# Patient Record
Sex: Male | Born: 1995 | Race: White | Hispanic: No | Marital: Single | Smoking: Current every day smoker
Health system: Southern US, Community
[De-identification: ages and names within clinical notes are randomized; demographics above are authoritative.]

## PROBLEM LIST (undated history)

## (undated) DIAGNOSIS — E669 Obesity, unspecified: Secondary | ICD-10-CM

## (undated) DIAGNOSIS — R51 Headache: Secondary | ICD-10-CM

## (undated) DIAGNOSIS — F4310 Post-traumatic stress disorder, unspecified: Secondary | ICD-10-CM

---
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
## 2019-01-09 ENCOUNTER — Emergency Department (HOSPITAL_COMMUNITY)
Admission: EM | Admit: 2019-01-09 | Discharge: 2019-01-10 | Disposition: A | Discharge status: Home / Self Care | Payer: Self-pay | Attending: Emergency Medicine | Admitting: Emergency Medicine

## 2019-01-09 ENCOUNTER — Encounter (HOSPITAL_COMMUNITY): Payer: Self-pay

## 2019-01-09 ENCOUNTER — Other Ambulatory Visit: Payer: Self-pay

## 2019-01-09 DIAGNOSIS — Z79899 Other long term (current) drug therapy: Secondary | ICD-10-CM | POA: Insufficient documentation

## 2019-01-09 DIAGNOSIS — F329 Major depressive disorder, single episode, unspecified: Secondary | ICD-10-CM | POA: Insufficient documentation

## 2019-01-09 DIAGNOSIS — R45851 Suicidal ideations: Secondary | ICD-10-CM | POA: Insufficient documentation

## 2019-01-09 DIAGNOSIS — R4587 Impulsiveness: Secondary | ICD-10-CM | POA: Insufficient documentation

## 2019-01-09 DIAGNOSIS — F1014 Alcohol abuse with alcohol-induced mood disorder: Secondary | ICD-10-CM

## 2019-01-09 DIAGNOSIS — F17290 Nicotine dependence, other tobacco product, uncomplicated: Secondary | ICD-10-CM | POA: Insufficient documentation

## 2019-01-09 DIAGNOSIS — Z008 Encounter for other general examination: Secondary | ICD-10-CM

## 2019-01-09 DIAGNOSIS — F1010 Alcohol abuse, uncomplicated: Secondary | ICD-10-CM | POA: Insufficient documentation

## 2019-01-09 DIAGNOSIS — R462 Strange and inexplicable behavior: Secondary | ICD-10-CM | POA: Insufficient documentation

## 2019-01-09 DIAGNOSIS — F419 Anxiety disorder, unspecified: Secondary | ICD-10-CM | POA: Insufficient documentation

## 2019-01-09 DIAGNOSIS — F17210 Nicotine dependence, cigarettes, uncomplicated: Secondary | ICD-10-CM | POA: Insufficient documentation

## 2019-01-09 HISTORY — DX: Post-traumatic stress disorder, unspecified: F43.10

## 2019-01-09 LAB — SALICYLATE LEVEL

## 2019-01-09 LAB — COMPREHENSIVE METABOLIC PANEL
ALK PHOS: 77 U/L (ref 38–126)
ALT: 40 U/L (ref 0–44)
AST: 28 U/L (ref 15–41)
Albumin: 4.6 g/dL (ref 3.5–5.0)
Anion gap: 10 (ref 5–15)
BUN: 9 mg/dL (ref 6–20)
CALCIUM: 8.9 mg/dL (ref 8.9–10.3)
CHLORIDE: 109 mmol/L (ref 98–111)
CO2: 23 mmol/L (ref 22–32)
Creatinine, Ser: 0.68 mg/dL (ref 0.61–1.24)
GFR calc Af Amer: >60 mL/min (ref >60)
GFR calc non Af Amer: >60 mL/min (ref >60)
GLUCOSE: 92 mg/dL (ref 70–99)
Potassium: 4 mmol/L (ref 3.5–5.1)
SODIUM: 142 mmol/L (ref 135–145)
Total Bilirubin: 0.4 mg/dL (ref 0.3–1.2)
Total Protein: 7.7 g/dL (ref 6.5–8.1)

## 2019-01-09 LAB — RAPID URINE DRUG SCREEN, HOSP PERFORMED
Amphetamines: NOT DETECTED
BARBITURATES: NOT DETECTED
BENZODIAZEPINES: NOT DETECTED
Cocaine: NOT DETECTED
OPIATES: NOT DETECTED
TETRAHYDROCANNABINOL: POSITIVE — AB

## 2019-01-09 LAB — CBC
HCT: 43.7 % (ref 39.0–52.0)
Hemoglobin: 14.5 g/dL (ref 13.0–17.0)
MCH: 31.5 pg (ref 26.0–34.0)
MCHC: 33.2 g/dL (ref 30.0–36.0)
MCV: 94.8 fL (ref 80.0–100.0)
NRBC: 0 % (ref 0.0–0.2)
Platelets: 440 10*3/uL — ABNORMAL HIGH (ref 150–400)
RBC: 4.61 MIL/uL (ref 4.22–5.81)
RDW: 12.7 % (ref 11.5–15.5)
WBC: 6.2 10*3/uL (ref 4.0–10.5)

## 2019-01-09 LAB — ACETAMINOPHEN LEVEL

## 2019-01-09 LAB — ETHANOL: Alcohol, Ethyl (B): 133 mg/dL — ABNORMAL HIGH (ref <10)

## 2019-01-09 MED ORDER — LORAZEPAM 2 MG/ML IJ SOLN: 0.0000 mg | Freq: Four times a day (QID) | INTRAMUSCULAR | Status: DC

## 2019-01-09 MED ORDER — THIAMINE HCL 100 MG/ML IJ SOLN: 100.0000 mg | Freq: Every day | INTRAMUSCULAR | Status: DC

## 2019-01-09 MED ORDER — VITAMIN B-1 100 MG PO TABS
100.0000 mg | ORAL_TABLET | Freq: Every day | ORAL | Status: DC
  Administered 2019-01-10: 100 mg via ORAL
  Filled 2019-01-09: qty 1

## 2019-01-09 MED ORDER — LORAZEPAM 2 MG/ML IJ SOLN: 0.0000 mg | Freq: Two times a day (BID) | INTRAMUSCULAR | Status: DC

## 2019-01-09 MED ORDER — LORAZEPAM 1 MG PO TABS: 0.0000 mg | ORAL_TABLET | Freq: Two times a day (BID) | ORAL | Status: DC

## 2019-01-09 MED ORDER — LORAZEPAM 1 MG PO TABS: 0.0000 mg | ORAL_TABLET | Freq: Four times a day (QID) | ORAL | Status: DC

## 2019-01-09 MED ORDER — LORAZEPAM 1 MG PO TABS
1.0000 mg | ORAL_TABLET | Freq: Once | ORAL | Status: AC
  Administered 2019-01-09: 1 mg via ORAL
  Filled 2019-01-09: qty 1

## 2019-01-09 NOTE — Progress Notes (Signed)
EKG hand delivered to Dr. Bebe Shaggy, MD.

## 2019-01-09 NOTE — ED Notes (Signed)
Bed: [facility]4 Expected date:  Expected time:  Means of arrival:  Comments: 

## 2019-01-09 NOTE — ED Triage Notes (Signed)
States came here at mothers request has had some alcohol earlier today but denies SI/HI today.
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
## 2019-01-09 NOTE — ED Notes (Signed)
Report on Situation, Background, Assessment, and Recommendations received from Triage, RN. Patient transferred to Acute Care Unit. Patient alert and in no visible distress, denies pain currently. Patient denies SI, HI, AVH and expresses he can remain safe on the unit. Patient made aware of Q15 minute rounds and security cameras for their safety. Patient instructed to come to me with needs or concerns. Pt. Given orientation of the unit and room. Pt. Given fluids and offered snack. Pt. Endorses his mood as a little anxious and depressed. Pt. States, "sometimes you just can't trust your family", referring to why he is here. Pt. Smells of alcohol and also reports he has been drinking just prior to admission to the unit.

## 2019-01-09 NOTE — Progress Notes (Signed)
Belongings placed in locker 43.

## 2019-01-09 NOTE — Progress Notes (Signed)
Patient arrived to the acute care unit at this time.

## 2019-01-10 DIAGNOSIS — F1014 Alcohol abuse with alcohol-induced mood disorder: Secondary | ICD-10-CM

## 2019-01-10 MED ORDER — GABAPENTIN 100 MG PO CAPS
200.0000 mg | ORAL_CAPSULE | Freq: Two times a day (BID) | ORAL | Status: DC
  Administered 2019-01-10: 200 mg via ORAL
  Filled 2019-01-10: qty 2

## 2019-01-10 NOTE — ED Notes (Signed)
Pt discharged home. Discharged instructions read to pt who verbalized understanding. All belongings returned to pt who signed for same. Denies SI/HI, is not delusional and not responding to internal stimuli. Escorted pt to the ED exit.    

## 2019-01-10 NOTE — Patient Outreach (Signed)
[facility]PSS met with the patient to provide substance use recovery support with lived experience from [place] and help with substance use treatment resources. Patient states that he does not want help with getting connected to substance use recovery resources and does not feel like his substance use is unmanageable at this time. CPSS still provided CPSS contact information. CPSS strongly encouraged the patient to follow up with [facility]PSS if his substance use becomes unmanageable and would like help getting connected to substance use recovery resources.

## 2019-01-10 NOTE — ED Notes (Addendum)
Nira Conn, NP, recommends overnight observation for safety and stabilization with psych re evaluation in the morning due to intoxication, patient was unable to answer needed questions. Patient gave permission for clinician to contact mother, but patient unable to remember mothers phone number information.
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
## 2019-01-10 NOTE — [facility] Suicide Risk Assessment (Cosign Needed)
Suicide Risk Assessment  Discharge Assessment   [facility] Discharge Suicide Risk Assessment   Principal Problem: Alcohol abuse with alcohol-induced mood disorder [facility]) Discharge Diagnoses: Principal Problem:   Alcohol abuse with alcohol-induced mood disorder (HCC)   Total Time spent with patient: 30 minutes  Musculoskeletal: Strength & Muscle Tone: within normal limits Gait & Station: normal Patient leans: N/A  Psychiatric Specialty Exam:   Blood pressure (!) 112/56, pulse 81, temperature 97.6 F (36.4 C), temperature source Oral, resp. rate 17, height 6\' 3"  (1.905 m), weight 81.6 kg, SpO2 99 %.Body mass index is 22.5 kg/m.  General Appearance: Casual  Eye Contact::  Good  Speech:  Clear and Coherent and Normal Rate409  Volume:  Normal  Mood:  Anxious  Affect:  Congruent  Thought Process:  Coherent, Goal Directed, Linear and Descriptions of Associations: Intact  Orientation:  Full (Time, Place, and Person)  Thought Content:  Logical  Suicidal Thoughts:  No  Homicidal Thoughts:  No  Memory:  Immediate;   Good Recent;   Good Remote;   Fair  Judgement:  Fair  Insight:  Fair  Psychomotor Activity:  Normal  Concentration:  Fair  Recall:  [employer] of Knowledge:Good  Language: Good  Akathisia:  No  Handed:  Right  AIMS (if indicated):     Assets:  Architect Housing Social Support  Sleep:     Cognition: WNL  ADL's:  Intact   Mental Status Per Nursing Assessment::   On Admission:   Pt was seen and chart reviewed by treatment team an Dr Jannifer Franklin. Pt is not sure why he is at the hospital. Pt's UDS positive for THC and BAL 133. Pt's mother wanted him to come to the hospital because he has been drinking. Pt was hospitalized in a psychiatric facility as a teen but currently does not take medications or see a psychiatrist. Pt will be referred to [facility] in [place] and also will be seen by peer support prior to discharge. Pt is  psychiatrically clear.   Demographic Factors:  Male, Adolescent or young adult, Caucasian, Low socioeconomic status and Unemployed  Loss Factors: Financial problems/change in socioeconomic status  Historical Factors: Family history of mental illness or substance abuse  Risk Reduction Factors:   Sense of responsibility to family  Continued Clinical Symptoms:  Alcohol/Substance Abuse/Dependencies  Cognitive Features That Contribute To Risk:  Closed-mindedness    Suicide Risk:  Minimal: No identifiable suicidal ideation.  Patients presenting with no risk factors but with morbid ruminations; may be classified as minimal risk based on the severity of the depressive symptoms  Follow-up Information    [facility] Follow up.   Contact information: [address] [place] [address] [phone]           Plan Of Care/Follow-up recommendations:  Activity:  as tolerated Diet:  Heart Healthy  Laveda Abbe, NP 01/10/2019, 12:42 PM

## 2019-01-26 ENCOUNTER — Encounter (HOSPITAL_COMMUNITY): Payer: Self-pay | Admitting: Emergency Medicine

## 2019-01-26 ENCOUNTER — Emergency Department (HOSPITAL_COMMUNITY)
Admission: EM | Admit: 2019-01-26 | Discharge: 2019-01-27 | Disposition: A | Discharge status: Other Acute Inpatient Hospital | Payer: Self-pay | Attending: Emergency Medicine | Admitting: Emergency Medicine

## 2019-01-26 ENCOUNTER — Emergency Department (HOSPITAL_COMMUNITY): Payer: Self-pay

## 2019-01-26 ENCOUNTER — Other Ambulatory Visit: Payer: Self-pay

## 2019-01-26 ENCOUNTER — Ambulatory Visit (HOSPITAL_COMMUNITY)
Admission: RE | Admit: 2019-01-26 | Discharge: 2019-01-26 | Disposition: A | Discharge status: Home / Self Care | Payer: Self-pay | Attending: Psychiatry | Admitting: Psychiatry

## 2019-01-26 DIAGNOSIS — T1491XA Suicide attempt, initial encounter: Secondary | ICD-10-CM | POA: Diagnosis present

## 2019-01-26 DIAGNOSIS — F333 Major depressive disorder, recurrent, severe with psychotic symptoms: Secondary | ICD-10-CM | POA: Insufficient documentation

## 2019-01-26 DIAGNOSIS — F17210 Nicotine dependence, cigarettes, uncomplicated: Secondary | ICD-10-CM | POA: Insufficient documentation

## 2019-01-26 DIAGNOSIS — R45851 Suicidal ideations: Secondary | ICD-10-CM | POA: Insufficient documentation

## 2019-01-26 LAB — COMPREHENSIVE METABOLIC PANEL
ALT: 25 U/L (ref 0–44)
AST: 24 U/L (ref 15–41)
Albumin: 4.8 g/dL (ref 3.5–5.0)
Alkaline Phosphatase: 80 U/L (ref 38–126)
Anion gap: 15 (ref 5–15)
BUN: 9 mg/dL (ref 6–20)
CO2: 19 mmol/L — AB (ref 22–32)
Calcium: 9.7 mg/dL (ref 8.9–10.3)
Chloride: 104 mmol/L (ref 98–111)
Creatinine, Ser: 0.86 mg/dL (ref 0.61–1.24)
GFR calc Af Amer: >60 mL/min (ref >60)
GFR calc non Af Amer: >60 mL/min (ref >60)
Glucose, Bld: 86 mg/dL (ref 70–99)
Potassium: 3.3 mmol/L — ABNORMAL LOW (ref 3.5–5.1)
Sodium: 138 mmol/L (ref 135–145)
Total Bilirubin: 1.5 mg/dL — ABNORMAL HIGH (ref 0.3–1.2)
Total Protein: 8.2 g/dL — ABNORMAL HIGH (ref 6.5–8.1)

## 2019-01-26 LAB — SALICYLATE LEVEL: Salicylate Lvl: <7 mg/dL (ref 2.8–30.0)

## 2019-01-26 LAB — CBC
HCT: 49.9 % (ref 39.0–52.0)
Hemoglobin: 17.1 g/dL — ABNORMAL HIGH (ref 13.0–17.0)
MCH: 31.6 pg (ref 26.0–34.0)
MCHC: 34.3 g/dL (ref 30.0–36.0)
MCV: 92.2 fL (ref 80.0–100.0)
NRBC: 0 % (ref 0.0–0.2)
Platelets: 481 10*3/uL — ABNORMAL HIGH (ref 150–400)
RBC: 5.41 MIL/uL (ref 4.22–5.81)
RDW: 12.9 % (ref 11.5–15.5)
WBC: 8.7 10*3/uL (ref 4.0–10.5)

## 2019-01-26 LAB — RAPID URINE DRUG SCREEN, HOSP PERFORMED
Amphetamines: POSITIVE — AB
Barbiturates: NOT DETECTED
Benzodiazepines: POSITIVE — AB
COCAINE: NOT DETECTED
Opiates: NOT DETECTED
Tetrahydrocannabinol: POSITIVE — AB

## 2019-01-26 LAB — ACETAMINOPHEN LEVEL: Acetaminophen (Tylenol), Serum: <10 ug/mL — ABNORMAL LOW (ref 10–30)

## 2019-01-26 LAB — ETHANOL: Alcohol, Ethyl (B): <10 mg/dL (ref <10)

## 2019-01-26 MED ORDER — LORAZEPAM 1 MG PO TABS
1.0000 mg | ORAL_TABLET | Freq: Once | ORAL | Status: AC | PRN
  Administered 2019-01-26: 1 mg via ORAL
  Filled 2019-01-26: qty 1

## 2019-01-26 MED ORDER — ALUM & MAG HYDROXIDE-SIMETH 200-200-20 MG/5ML PO SUSP: 30.0000 mL | Freq: Four times a day (QID) | ORAL | Status: DC | PRN

## 2019-01-26 MED ORDER — POTASSIUM CHLORIDE CRYS ER 20 MEQ PO TBCR
40.0000 meq | EXTENDED_RELEASE_TABLET | Freq: Once | ORAL | Status: AC
  Administered 2019-01-26: 40 meq via ORAL
  Filled 2019-01-26: qty 2

## 2019-01-26 MED ORDER — NICOTINE 21 MG/24HR TD PT24
21.0000 mg | MEDICATED_PATCH | Freq: Every day | TRANSDERMAL | Status: DC
  Administered 2019-01-27: 21 mg via TRANSDERMAL
  Filled 2019-01-26 (×2): qty 1

## 2019-01-26 MED ORDER — ONDANSETRON HCL 4 MG PO TABS: 4.0000 mg | ORAL_TABLET | Freq: Three times a day (TID) | ORAL | Status: DC | PRN

## 2019-01-26 MED ORDER — ZOLPIDEM TARTRATE 5 MG PO TABS
5.0000 mg | ORAL_TABLET | Freq: Every evening | ORAL | Status: DC | PRN
  Administered 2019-01-26: 5 mg via ORAL
  Filled 2019-01-26: qty 1

## 2019-01-26 MED ORDER — IBUPROFEN 200 MG PO TABS: 600.0000 mg | ORAL_TABLET | Freq: Three times a day (TID) | ORAL | Status: DC | PRN

## 2019-01-26 NOTE — ED Notes (Addendum)
Per Inetta Fermo, Mark Hebert answered "yes" to covid questions-states he has a "smoker's cough", body aches-states he uses "sage"-states it is an "illness" and he is suicidal-needs to be cleared medically then probably discharged

## 2019-01-26 NOTE — ED Notes (Signed)
Bed: [facility] Expected date:  Expected time:  Means of arrival:  Comments: T9

## 2019-01-26 NOTE — ED Notes (Signed)
Patient changed into paper scrubs and belongings removed from room. 
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
## 2019-01-26 NOTE — ED Notes (Signed)
Bed: WTR9 Expected date:  Expected time:  Means of arrival:  Comments: 

## 2019-01-26 NOTE — ED Notes (Signed)
Patient is alert and oriented to person, place and time.  Presents with anxious affect and mood.  Denies suicidal thoughts but reports auditory hallucinations.  Potassium supplement given for low potassium.  Routine safety checks maintained every 15 minutes and via security camera.  Patient is safe on the unit.

## 2019-01-26 NOTE — BH Assessment (Signed)
Patient presented to [facility] behavioral health for evaluation. Patient verbalizes increasing cough x 2 weeks and generalized body aches x 24 hours. Per infection prevention patient directed to [facility]. Patient transported by mother. Report called to [facility] charge RN.

## 2019-01-26 NOTE — ED Notes (Signed)
Patient wanded by security. 

## 2019-01-26 NOTE — ED Triage Notes (Signed)
Patient from [facility] c/o SI without plan. States "it's more of an impulsive decision." Reports cough since "I started smoking when I was 15." Denies N/V/D and fevers. Reports body aches x 10 days.

## 2019-01-26 NOTE — ED Provider Notes (Signed)
[facility]-EMERGENCY DEPT Provider Note   CSN: [id number] Arrival date & time: 01/26/19  1116    History   Chief Complaint Chief Complaint  Patient presents with  . Suicidal    HPI Mark Hebert is a 23 y.o. male with a hx of tobacco abuse, PTSD, and EtOH abuse who presents to the ED with complaints of SI. Patient states he has been having thoughts of self harm, or more so "impulses". He states this morning he sat in a bathtub and dropped a plugged in iron into the water in attempts to harm himself, he states nothing happened when he dropped the iron and he got out of the tub. No other attempts made. He reports auditory hallucinations, thinking people are trying to get him. Reports vague HI without specific plans or individual he feels this way towards.   When asked about other sxs he does mention cough that is intermittently productive, has had since he started smoking. Worse when he smokes. States this may have been worse over the past 2 weeks. No other alleviating/aggravating factors. Feels achy all over at times. Denies fever, chills, dyspnea, emesis, abdominal pain, or diarrhea. No recent travel or confirmed covid19 exposure. Was seen at [facility] and sent to ER for medical clearance due to cough.      HPI  Past Medical History:  Diagnosis Date  . Headache(784.0)    states he gets sometimes  . Obesity   . Post traumatic stress disorder   . PTSD (post-traumatic stress disorder)     Patient Active Problem List   Diagnosis Date Noted  . Alcohol abuse with alcohol-induced mood disorder (HCC) 01/10/2019  . Post traumatic stress disorder 11/07/2011    Past Surgical History:  Procedure Laterality Date  . APPENDECTOMY  Nov 2012        Home Medications    Prior to Admission medications   Medication Sig Start Date End Date Taking? Authorizing Provider  citalopram (CELEXA) 20 MG tablet Take 1 tablet (20 mg total) by mouth daily with breakfast. 12/02/11 12/01/12   Gayland Curry, MD  ibuprofen (ADVIL,MOTRIN) 200 MG tablet Take 400 mg by mouth every 6 (six) hours as needed for headache, mild pain or moderate pain.    [provider]  risperiDONE (RISPERDAL) 1 MG tablet Take 1 tablet (1 mg total) by mouth 2 (two) times daily. 12/02/11 01/01/12  Gayland Curry, MD    Family History Family History  Problem Relation Age of Onset  . Bipolar disorder Mother     Social History Social History   Tobacco Use  . Smoking status: Current Every Day Smoker    Packs/day: 0.25    Years: 5.00    Pack years: 1.25    Types: Cigarettes, Cigars  . Smokeless tobacco: Never Used  . Tobacco comment: would like nicotine patches  Substance Use Topics  . Alcohol use: No  . Drug use: Yes    Frequency: 2.0 times per week    Types: Marijuana    Comment: last use September 25, 2011     Allergies   Patient has no known allergies.   Review of Systems Review of Systems  Constitutional: Negative for chills and fever.  HENT: Negative for ear pain and sore throat.   Respiratory: Positive for cough. Negative for shortness of breath.   Cardiovascular: Negative for chest pain.  Gastrointestinal: Negative for abdominal pain, diarrhea, nausea and vomiting.  Musculoskeletal: Positive for myalgias (intermittent).  Psychiatric/Behavioral: Positive for hallucinations,  self-injury and suicidal ideas.       Positive for HI.   All other systems reviewed and are negative.   Physical Exam Updated Vital Signs BP 118/79 (BP Location: Right Arm)   Pulse 99   Temp 97.8 F (36.6 C) (Oral)   Resp 15   Ht  (1.905 m)   SpO2 100%   BMI 22.50 kg/m   Physical Exam Vitals signs and nursing note reviewed.  Constitutional:      General: He is not in acute distress.    Appearance: He is well-developed. He is not toxic-appearing.  HENT:     Head: Normocephalic and atraumatic.     Right Ear: Tympanic membrane is not perforated, erythematous, retracted or  bulging.     Left Ear: Tympanic membrane is not perforated, erythematous, retracted or bulging.     Nose: Nose normal.     Mouth/Throat:     Mouth: Mucous membranes are moist.     Pharynx: No pharyngeal swelling, oropharyngeal exudate or posterior oropharyngeal erythema.     Comments: Posterior oropharynx is symmetric appearing. Patient tolerating own secretions without difficulty. No trismus. No drooling. No hot potato voice. No swelling beneath the tongue, submandibular compartment is soft.  Eyes:     General:        Right eye: No discharge.        Left eye: No discharge.     Conjunctiva/sclera: Conjunctivae normal.  Neck:     Musculoskeletal: Normal range of motion and neck supple.  Cardiovascular:     Rate and Rhythm: Normal rate and regular rhythm.  Pulmonary:     Effort: Pulmonary effort is normal. No respiratory distress.     Breath sounds: Normal breath sounds. No wheezing, rhonchi or rales.     Comments: SpO2 100% on RA.  Abdominal:     General: There is no distension.     Palpations: Abdomen is soft.     Tenderness: There is no abdominal tenderness.  Skin:    General: Skin is warm and dry.     Findings: No rash.  Neurological:     Mental Status: He is alert.     Comments: Clear speech.   Psychiatric:        Speech: Speech is tangential.        Thought Content: Thought content includes homicidal and suicidal ideation. Thought content includes suicidal plan.    ED Treatments / Results  Labs (all labs ordered are listed, but only abnormal results are displayed) Labs Reviewed - No data to display  EKG None  Radiology No results found.  Procedures Procedures (including critical care time)  Medications Ordered in ED Medications - No data to display   Initial Impression / Assessment and Plan / ED Course  I have reviewed the triage vital signs and the nursing notes.  Pertinent labs & imaging results that were available during my care of the patient were  reviewed by me and considered in my medical decision making (see chart for details).   Patient presents to the ED with SI requiring medical clearance. Nontoxic appearing, no apparent distress, vitals WNL on my assessment.   Sent from [facility] secondary to cough. Patient's cough has been ongoing w/ his tobacco abuse hx, is not necessarily new, maybe worse over past 2 weeks. Lungs CTA. No wheezing to necessitate albuterol. Afebrile, no infiltrate on CXR, no focal adventitious sounds, doubt pneumonia. CXR additionally negative for pneumothorax, effusion, or edema. Patient is not hypoxic or  tachypneic. No travel/exposures, does not meet hospital criteria for covid19 testing. Given cough worsened over past 2 weeks considered this diagnosis, however even if + for covid at this point 14 days out from onset therefore would not necessarily require quarantine at this time. Additionally suspicion for this is low.   Mark Hebert was evaluated in Emergency Department on 01/26/2019 for the symptoms described in the history of present illness. He/she was evaluated in the context of the global COVID-19 pandemic, which necessitated consideration that the patient might be at risk for infection with the SARS-CoV-2 virus that causes COVID-19. Institutional protocols and algorithms that pertain to the evaluation of patients at risk for COVID-19 are in a state of rapid change based on information released by regulatory bodies including the CDC and federal and state organizations. These policies and algorithms were followed during the patient's care in the ED.  Screening labs reviewed & fairly unremarkable. Given dose of oral potassium for mild hypokalemia. No anemia/leukocytosis. Elevated platelets consistent with prior. UDS + for benzodiazepines, amphetamines, and tetrahydrocannabinols. Ethanol, salicylate, and acetaminophen levels are WNL.   Patient medically cleared for TTS evaluation, holding orders placed, no current home meds  that he is taking for re-ordering. Disposition per [facility].   Final Clinical Impressions(s) / ED Diagnoses   Final diagnoses:  Suicidal ideation    ED Discharge Orders    None       Cherly Anderson, PA-C 01/26/19 1450    Tilden Fossa, MD 01/26/19 [phone]

## 2019-01-26 NOTE — ED Notes (Signed)
XR at bedside

## 2019-01-26 NOTE — ED Notes (Signed)
Pt A&O x 3, no distress noted, cooperative and anxious, remains SI.  Monitoring for safety, Q 15 min checks in effect.

## 2019-01-27 ENCOUNTER — Other Ambulatory Visit: Payer: Self-pay

## 2019-01-27 ENCOUNTER — Encounter (HOSPITAL_COMMUNITY): Payer: Self-pay | Admitting: *Deleted

## 2019-01-27 ENCOUNTER — Inpatient Hospital Stay (HOSPITAL_COMMUNITY)
Admission: AD | Admit: 2019-01-27 | Discharge: 2019-02-01 | DRG: 897 | Disposition: A | Discharge status: Home / Self Care | Payer: No Typology Code available for payment source | Attending: Psychiatry | Admitting: Psychiatry

## 2019-01-27 DIAGNOSIS — Z62810 Personal history of physical and sexual abuse in childhood: Secondary | ICD-10-CM | POA: Diagnosis present

## 2019-01-27 DIAGNOSIS — Z915 Personal history of self-harm: Secondary | ICD-10-CM

## 2019-01-27 DIAGNOSIS — Z590 Homelessness: Secondary | ICD-10-CM

## 2019-01-27 DIAGNOSIS — F3289 Other specified depressive episodes: Secondary | ICD-10-CM

## 2019-01-27 DIAGNOSIS — F1910 Other psychoactive substance abuse, uncomplicated: Secondary | ICD-10-CM

## 2019-01-27 DIAGNOSIS — R45851 Suicidal ideations: Secondary | ICD-10-CM | POA: Diagnosis present

## 2019-01-27 DIAGNOSIS — F17290 Nicotine dependence, other tobacco product, uncomplicated: Secondary | ICD-10-CM | POA: Diagnosis not present

## 2019-01-27 DIAGNOSIS — F15159 Other stimulant abuse with stimulant-induced psychotic disorder, unspecified: Secondary | ICD-10-CM | POA: Diagnosis present

## 2019-01-27 DIAGNOSIS — F1210 Cannabis abuse, uncomplicated: Secondary | ICD-10-CM | POA: Diagnosis present

## 2019-01-27 DIAGNOSIS — F1994 Other psychoactive substance use, unspecified with psychoactive substance-induced mood disorder: Secondary | ICD-10-CM | POA: Diagnosis present

## 2019-01-27 DIAGNOSIS — Z818 Family history of other mental and behavioral disorders: Secondary | ICD-10-CM

## 2019-01-27 DIAGNOSIS — F17210 Nicotine dependence, cigarettes, uncomplicated: Secondary | ICD-10-CM | POA: Diagnosis present

## 2019-01-27 DIAGNOSIS — F4310 Post-traumatic stress disorder, unspecified: Secondary | ICD-10-CM | POA: Diagnosis present

## 2019-01-27 DIAGNOSIS — T1491XA Suicide attempt, initial encounter: Secondary | ICD-10-CM

## 2019-01-27 MED ORDER — ONDANSETRON HCL 4 MG PO TABS: 4.0000 mg | ORAL_TABLET | Freq: Three times a day (TID) | ORAL | Status: DC | PRN

## 2019-01-27 MED ORDER — ALUM & MAG HYDROXIDE-SIMETH 200-200-20 MG/5ML PO SUSP: 30.0000 mL | ORAL | Status: DC | PRN

## 2019-01-27 MED ORDER — RISPERIDONE 1 MG PO TABS
1.0000 mg | ORAL_TABLET | Freq: Two times a day (BID) | ORAL | Status: DC
  Administered 2019-01-27: 1 mg via ORAL
  Filled 2019-01-27: qty 1

## 2019-01-27 MED ORDER — IBUPROFEN 600 MG PO TABS: 600.0000 mg | ORAL_TABLET | Freq: Three times a day (TID) | ORAL | Status: DC | PRN

## 2019-01-27 MED ORDER — CITALOPRAM HYDROBROMIDE 20 MG PO TABS
20.0000 mg | ORAL_TABLET | Freq: Every day | ORAL | Status: DC
  Administered 2019-01-28: 20 mg via ORAL
  Filled 2019-01-27 (×3): qty 1

## 2019-01-27 MED ORDER — TRAZODONE HCL 50 MG PO TABS
50.0000 mg | ORAL_TABLET | Freq: Every evening | ORAL | Status: DC | PRN
  Administered 2019-01-27: 50 mg via ORAL
  Filled 2019-01-27: qty 14
  Filled 2019-01-27: qty 1

## 2019-01-27 MED ORDER — ALUM & MAG HYDROXIDE-SIMETH 200-200-20 MG/5ML PO SUSP: 30.0000 mL | Freq: Four times a day (QID) | ORAL | Status: DC | PRN

## 2019-01-27 MED ORDER — NICOTINE 21 MG/24HR TD PT24
21.0000 mg | MEDICATED_PATCH | Freq: Every day | TRANSDERMAL | Status: DC
  Administered 2019-01-28 – 2019-02-01 (×5): 21 mg via TRANSDERMAL
  Filled 2019-01-27 (×8): qty 1

## 2019-01-27 MED ORDER — RISPERIDONE 1 MG PO TABS
1.0000 mg | ORAL_TABLET | Freq: Two times a day (BID) | ORAL | Status: DC
  Administered 2019-01-27 – 2019-01-28 (×2): 1 mg via ORAL
  Filled 2019-01-27 (×7): qty 1

## 2019-01-27 MED ORDER — ACETAMINOPHEN 325 MG PO TABS: 650.0000 mg | ORAL_TABLET | Freq: Four times a day (QID) | ORAL | Status: DC | PRN

## 2019-01-27 MED ORDER — CITALOPRAM HYDROBROMIDE 10 MG PO TABS
20.0000 mg | ORAL_TABLET | Freq: Every day | ORAL | Status: DC
  Administered 2019-01-27: 20 mg via ORAL
  Filled 2019-01-27: qty 2

## 2019-01-27 MED ORDER — HYDROXYZINE HCL 25 MG PO TABS
25.0000 mg | ORAL_TABLET | Freq: Three times a day (TID) | ORAL | Status: DC | PRN
  Administered 2019-01-28: 25 mg via ORAL
  Filled 2019-01-27: qty 10
  Filled 2019-01-27: qty 1

## 2019-01-27 MED ORDER — MAGNESIUM HYDROXIDE 400 MG/5ML PO SUSP: 30.0000 mL | Freq: Every day | ORAL | Status: DC | PRN

## 2019-01-27 NOTE — Progress Notes (Signed)
Pt requested a room change due to fear of his safety. Pt's roommate was cursing and yelling loudly.  Room change proceeded.

## 2019-01-27 NOTE — Tx Team (Signed)
Initial Treatment Plan 01/27/2019 7:26 PM Diland Novosad GYI:[id number]    PATIENT STRESSORS: Marital or family conflict Substance abuse   PATIENT STRENGTHS: Ability for insight Average or above average intelligence Capable of independent living General fund of knowledge Supportive family/friends   PATIENT IDENTIFIED PROBLEMS: Depression Suicidal thoughts Substance abuse "Maybe get on some kind of medicine"                     DISCHARGE CRITERIA:  Ability to meet basic life and health needs Improved stabilization in mood, thinking, and/or behavior Reduction of life-threatening or endangering symptoms to within safe limits Verbal commitment to aftercare and medication compliance  PRELIMINARY DISCHARGE PLAN: Attend aftercare/continuing care group Return to previous living arrangement  PATIENT/FAMILY INVOLVEMENT: This treatment plan has been presented to and reviewed with the patient, Mark Hebert, and/or family member, .  The patient and family have been given the opportunity to ask questions and make suggestions.  Eila Runyan, [place], [place] 01/27/2019, 7:26 PM

## 2019-01-27 NOTE — Consult Note (Addendum)
[facility]   Reason for Consult:  Suicide attempt  Referring Physician:  EDP Patient Identification: Mark Hebert MRN:  [id number] Principal Diagnosis: Suicide attempt [facility](HCC) Diagnosis:  Principal Problem:   Suicide attempt [facility](HCC)   Total Time spent with patient: 30 minutes  Subjective:   Mark Hebert is a 23 y.o. male patient admitted with suicide attempt.  HPI:   Per chart review, patient was admitted after a suicide attempt by placing a plugged in iron into a bathtub to electrocute himself. Today he reports that he has been feeling down and endorses AVH during the time of his suicide attempt. He denies SI, HI or AVH at this time. He reports attempting suicide when he was a teenager. He is not currently taking medications. He reports recent methamphetamine and Valium use. 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   Past Psychiatric History: PTSD and depression.   Risk to Self: Suicidal Ideation: Yes-Currently Present Suicidal Intent: Yes-Currently Present Is patient at risk for suicide?: Yes Suicidal Plan?: Yes-Currently Present Specify Current Suicidal Plan: electrocute self Access to Means: Yes Specify Access to Suicidal Means: put a clothing iron in bath tub What has been your use of drugs/alcohol within the last 12 months?: marijuana, crystal meth, and valium use How many times?: 8 Other Self Harm Risks: none Triggers for Past Attempts: Unpredictable Intentional Self Injurious Behavior: None Risk to Others: Homicidal Ideation: No Thoughts of Harm to Others: No Current Homicidal Intent: No Current Homicidal Plan: No Access to Homicidal Means: No Identified Victim: Mark Hebert denies History of harm to others?: No Assessment of Violence: None Noted Violent Behavior Description: none Does patient have access to weapons?: No Criminal Charges Pending?: No Does patient have a court date: No Prior Inpatient Therapy: Prior Inpatient Therapy:  Yes Prior Therapy Dates: 2013 [facility] Reason for Treatment: SI  Prior Outpatient Therapy: Prior Outpatient Therapy: Yes Prior Therapy Dates: 2019 Prior Therapy Facilty/Provider(s): "somewhere in [place]"-Mark Hebert doesn't remember Reason for Treatment: unknown Does patient have an ACCT team?: No Does patient have Intensive In-House Services?  : No Does patient have [facility] services? : No Does patient have P4CC services?: No  Past Medical History:  Past Medical History:  Diagnosis Date  . Headache(784.0)    states he gets sometimes  . Obesity   . Post traumatic stress disorder   . PTSD (post-traumatic stress disorder)     Past Surgical History:  Procedure Laterality Date  . APPENDECTOMY  Nov 2012   Family History:  Family History  Problem Relation Age of Onset  . Bipolar disorder Mother    Family Psychiatric  History: As listed above.  Social History:  Social History   Substance and Sexual Activity  Alcohol Use No     Social History   Substance and Sexual Activity  Drug Use Yes  . Frequency: 2.0 times per week  . Types: Marijuana   Comment: last use September 25, 2011    Social History   Socioeconomic History  . Marital status: Single    Spouse name: Not on file  . Number of children: Not on file  . Years of education: Not on file  . Highest education level: Not on file  Occupational History  . Occupation: Consulting civil engineertudent    Comment: 9th grade at [employer]  Social Needs  . Financial resource strain: Not on file  . Food insecurity:    Worry: Not on file    Inability: Not  on file  . Transportation needs:    Medical: Not on file    Non-medical: Not on file  Tobacco Use  . Smoking status: Current Every Day Smoker    Packs/day: 0.25    Years: 5.00    Pack years: 1.25    Types: Cigarettes, Cigars  . Smokeless tobacco: Never Used  . Tobacco comment: would like nicotine patches  Substance and Sexual Activity  . Alcohol use: No   . Drug use: Yes    Frequency: 2.0 times per week    Types: Marijuana    Comment: last use September 25, 2011  . Sexual activity: Never  Lifestyle  . Physical activity:    Days per week: Not on file    Minutes per session: Not on file  . Stress: Not on file  Relationships  . Social connections:    Talks on phone: Not on file    Gets together: Not on file    Attends religious service: Not on file    Active member of club or organization: Not on file    Attends meetings of clubs or organizations: Not on file    Relationship status: Not on file  Other Topics Concern  . Not on file  Social History Narrative  . Not on file   Additional Social History: He is homeless x 2 years although he intermittently stays with his mother.     Allergies:  No Known Allergies  Labs:  Results for orders placed or performed during the hospital encounter of 01/26/19 (from the past 48 hour(s))  CBC     Status: Abnormal   Collection Time: 01/26/19 12:01 PM  Result Value Ref Range   WBC 8.7 4.0 - 10.5 K/uL   RBC 5.41 4.22 - 5.81 MIL/uL   Hemoglobin 17.1 (H) 13.0 - 17.0 g/dL   HCT 33.0 07.6 - 22.6 %   MCV 92.2 80.0 - 100.0 fL   MCH 31.6 26.0 - 34.0 pg   MCHC 34.3 30.0 - 36.0 g/dL   RDW 33.3 54.5 - 62.5 %   Platelets 481 (H) 150 - 400 K/uL   nRBC 0.0 0.0 - 0.2 %    Comment: Performed at [facility], [address], [place], [place] [address]  Comprehensive metabolic panel     Status: Abnormal   Collection Time: 01/26/19 12:01 PM  Result Value Ref Range   Sodium 138 135 - 145 mmol/L   Potassium 3.3 (L) 3.5 - 5.1 mmol/L   Chloride 104 98 - 111 mmol/L   CO2 19 (L) 22 - 32 mmol/L   Glucose, Bld 86 70 - 99 mg/dL   BUN 9 6 - 20 mg/dL   Creatinine, Ser 7.34 0.61 - 1.24 mg/dL   Calcium 9.7 8.9 - 28.7 mg/dL   Total Protein 8.2 (H) 6.5 - 8.1 g/dL   Albumin 4.8 3.5 - 5.0 g/dL   AST 24 15 - 41 U/L   ALT 25 0 - 44 U/L   Alkaline Phosphatase 80 38 - 126 U/L   Total Bilirubin 1.5  (H) 0.3 - 1.2 mg/dL   GFR calc non Af Amer >60 >60 mL/min   GFR calc Af Amer >60 >60 mL/min   Anion gap 15 5 - 15    Comment: Performed at [facility], [address], [place], [place] [address]  Ethanol     Status: None   Collection Time: 01/26/19 12:01 PM  Result Value Ref Range   Alcohol, Ethyl (  B) <10 <10 mg/dL    Comment: (NOTE) Lowest detectable limit for serum alcohol is 10 mg/dL. For medical purposes only. Performed at [facility], [address], [place], [place] [address]   Acetaminophen level     Status: Abnormal   Collection Time: 01/26/19 12:01 PM  Result Value Ref Range   Acetaminophen (Tylenol), Serum <10 (L) 10 - 30 ug/mL    Comment: (NOTE) Therapeutic concentrations vary significantly. A range of 10-30 ug/mL  may be an effective concentration for many patients. However, some  are best treated at concentrations outside of this range. Acetaminophen concentrations >150 ug/mL at 4 hours after ingestion  and >50 ug/mL at 12 hours after ingestion are often associated with  toxic reactions. Performed at [facility] [address], [place], [place] [address]   Salicylate level     Status: None   Collection Time: 01/26/19 12:01 PM  Result Value Ref Range   Salicylate Lvl <7.0 2.8 - 30.0 mg/dL    Comment: Performed at [facility], [address], [place], [place] [address]  Urine rapid drug screen (hosp performed)     Status: Abnormal   Collection Time: 01/26/19 12:49 PM  Result Value Ref Range   Opiates NONE DETECTED NONE DETECTED   Cocaine NONE DETECTED NONE DETECTED   Benzodiazepines POSITIVE (A) NONE DETECTED   Amphetamines POSITIVE (A) NONE DETECTED   Tetrahydrocannabinol POSITIVE (A) NONE DETECTED   Barbiturates NONE DETECTED NONE DETECTED    Comment: (NOTE) DRUG SCREEN FOR MEDICAL PURPOSES ONLY.  IF CONFIRMATION IS NEEDED FOR ANY PURPOSE, NOTIFY LAB WITHIN 5 DAYS. LOWEST  DETECTABLE LIMITS FOR URINE DRUG SCREEN Drug Class                     Cutoff (ng/mL) Amphetamine and metabolites    1000 Barbiturate and metabolites    200 Benzodiazepine                 200 Tricyclics and metabolites     300 Opiates and metabolites        300 Cocaine and metabolites        300 THC                            50 Performed at [facility], [address] [place], [place] [address]     Current Facility-Administered Medications  Medication Dose Route Frequency Provider Last Rate Last Dose  . alum & mag hydroxide-simeth (MAALOX/MYLANTA) 200-200-20 MG/5ML suspension 30 mL  30 mL Oral Q6H PRN Petrucelli, Samantha R, PA-C      . ibuprofen (ADVIL,MOTRIN) tablet 600 mg  600 mg Oral Q8H PRN Petrucelli, Samantha R, PA-C      . nicotine (NICODERM CQ - dosed in mg/24 hours) patch 21 mg  21 mg Transdermal Daily Petrucelli, Samantha R, PA-C      . ondansetron (ZOFRAN) tablet 4 mg  4 mg Oral Q8H PRN Petrucelli, Samantha R, PA-C      . zolpidem (AMBIEN) tablet 5 mg  5 mg Oral QHS PRN Petrucelli, Samantha R, PA-C   5 mg at 01/26/19 2057   Current Outpatient Medications  Medication Sig Dispense Refill  . citalopram (CELEXA) 20 MG tablet Take 1 tablet (20 mg total) by mouth daily with breakfast. 30 tablet 0  . ibuprofen (ADVIL,MOTRIN) 200 MG tablet Take 400 mg by mouth every 6 (six) hours as needed  for headache, mild pain or moderate pain.    Marland Kitchen risperiDONE (RISPERDAL) 1 MG tablet Take 1 tablet (1 mg total) by mouth 2 (two) times daily. 30 tablet 0    Musculoskeletal: Strength & Muscle Tone: within normal limits Gait & Station: UTA since patient is lying in bed. Patient leans: N/A  Psychiatric Specialty Exam: Physical Exam  Nursing note and vitals reviewed. Constitutional: He is oriented to person, place, and time. He appears well-developed and well-nourished.  HENT:  Head: Normocephalic and atraumatic.  Neck: Normal range of motion.  Respiratory: Effort  normal.  Musculoskeletal: Normal range of motion.  Neurological: He is alert and oriented to person, place, and time.  Psychiatric: His behavior is normal. Thought content normal. His speech is slurred. Cognition and memory are normal. He expresses impulsivity. He exhibits a depressed mood.    Review of Systems  Psychiatric/Behavioral: Positive for depression, substance abuse and suicidal ideas. Negative for hallucinations.  All other systems reviewed and are negative.   Blood pressure 108/70, pulse 72, temperature (!) 97.5 F (36.4 C), temperature source Oral, resp. rate 18, height  (1.905 m), SpO2 99 %.Body mass index is 22.5 kg/m.  General Appearance: Fairly Groomed, young, Caucasian male, wearing paper hospital scrubs who is lying in bed. NAD.   Eye Contact:  Fair  Speech:  Slow and Slurred  Volume:  Decreased  Mood:  Depressed  Affect:  Constricted  Thought Process:  Linear and Descriptions of Associations: Intact  Orientation:  Full (Time, Place, and Person)  Thought Content:  Logical  Suicidal Thoughts:  Yes.  with intent/plan  Homicidal Thoughts:  No  Memory:  Immediate;   Good Recent;   Good Remote;   Good  Judgement:  Poor  Insight:  Fair  Psychomotor Activity:  Normal  Concentration:  Concentration: Good and Attention Span: Good  Recall:  Good  Fund of Knowledge:  Good  Language:  Good  Akathisia:  No  Handed:  Right  AIMS (if indicated):   N/A  Assets:  Communication Skills Housing Physical Health Resilience Social Support  ADL's:  Intact  Cognition:  WNL  Sleep:   N/A   Assessment:  Brandan Robicheaux is a 23 y.o. male who was admitted with suicide attempt in the setting of depression and substance use. He warrants inpatient psychiatric hospitalization for stabilization and treatment.   Treatment Plan Summary: Daily contact with patient to assess and evaluate symptoms and progress in treatment and Medication management  -Continue home medications: Celexa 20  mg daily and Risperdal 1 mg BID for mood stabilization.  -EKG reviewed and QTc 395. Will closely monitor when starting or increasing QTc prolonging agents.    Disposition: Recommend psychiatric Inpatient admission when medically cleared.  Cherly Beach, DO 01/27/2019 11:55 AM

## 2019-01-27 NOTE — BH Assessment (Signed)
[facility] Assessment Progress Note  Per Juanetta Beets, DO, this pt requires psychiatric hospitalization.  Malva Limes, RN, [facility] has assigned pt to [facility] Rm [id number]; [facility] will be ready to receive pt at 16:00.  Dr Sharma Covert also finds that pt meets criteria for IVC, which she has initiated.  IVC documents have been sent by e-mail attachment to [place], and at 14:47 Burna Cash confirms receipt.  She has since faxed Findings and Custody Order to this Clinical research associate.  At 14:47 I called [employer] and spoke to [employer], who took demographic information, agreeing to dispatch law enforcement to fill out Return of Service.  As of this writing, arrival of law enforcement is pending.  Petition and First Examination have been faxed to [facility].  Pt's nurse, Diane, has been notified, and agrees to call report to [phone].  Pt is to be transported via Patent examiner.   Doylene Canning, [place] Behavioral Health Coordinator [phone]

## 2019-01-27 NOTE — ED Notes (Signed)
Pt demanded to leave and therefore was committed to IVC to facilitate admission to a hospital for further care. He is irritable about it, but continues to be cooperative. He raised his voice with his mother on the telephone and was redirected to stay in control, which he was able to.

## 2019-01-27 NOTE — ED Notes (Signed)
Transported to [facility] by [employer]. PT was cooperative. PT was cooperative.

## 2019-01-27 NOTE — Progress Notes (Signed)
Mark Hebert is a 23 year old male pt admitted on involuntary basis. On admission, he presents as irritable and reports that he does not need to be here and wants to go home. He denies SI currently and is able to contract for safety while in the hospital. He reports that his mother brought him into the hospital. When asked about putting an iron in the bathtub he denied this but then reports that he was high at the time. He does endorse that he has been feeling suicidal and he did speak about trying to overdose recently. He reports that he has been abusing multiple substances including alcohol but does not show any overt signs or symptoms of withdrawal on admission. He reports that he is not on any medications currently and spoke about how maybe he needs to be on something. He reports that he lives in the [place] area with his mother and reports that he will return there once he is discharged. Mark Hebert was escorted to the unit, oriented to the milieu and safety maintained.

## 2019-01-27 NOTE — ED Notes (Signed)
Report called to Alissa at [facility]. [employer] called for transport.

## 2019-01-28 DIAGNOSIS — Z818 Family history of other mental and behavioral disorders: Secondary | ICD-10-CM

## 2019-01-28 DIAGNOSIS — F1994 Other psychoactive substance use, unspecified with psychoactive substance-induced mood disorder: Secondary | ICD-10-CM

## 2019-01-28 DIAGNOSIS — F17210 Nicotine dependence, cigarettes, uncomplicated: Secondary | ICD-10-CM

## 2019-01-28 MED ORDER — BENZTROPINE MESYLATE 0.5 MG PO TABS
0.5000 mg | ORAL_TABLET | Freq: Two times a day (BID) | ORAL | Status: DC
  Administered 2019-01-28 – 2019-02-01 (×8): 0.5 mg via ORAL
  Filled 2019-01-28: qty 1
  Filled 2019-01-28: qty 28
  Filled 2019-01-28 (×7): qty 1
  Filled 2019-01-28: qty 28
  Filled 2019-01-28 (×2): qty 1

## 2019-01-28 MED ORDER — RISPERIDONE 3 MG PO TABS
3.0000 mg | ORAL_TABLET | Freq: Two times a day (BID) | ORAL | Status: DC
  Administered 2019-01-28 – 2019-02-01 (×8): 3 mg via ORAL
  Filled 2019-01-28: qty 28
  Filled 2019-01-28 (×7): qty 1
  Filled 2019-01-28: qty 28
  Filled 2019-01-28 (×3): qty 1

## 2019-01-28 MED ORDER — GABAPENTIN 300 MG PO CAPS
300.0000 mg | ORAL_CAPSULE | Freq: Three times a day (TID) | ORAL | Status: DC
  Administered 2019-01-28 – 2019-02-01 (×13): 300 mg via ORAL
  Filled 2019-01-28 (×4): qty 1
  Filled 2019-01-28: qty 42
  Filled 2019-01-28: qty 1
  Filled 2019-01-28: qty 42
  Filled 2019-01-28 (×3): qty 1
  Filled 2019-01-28: qty 42
  Filled 2019-01-28 (×7): qty 1

## 2019-01-28 MED ORDER — FLUOXETINE HCL 20 MG PO CAPS
20.0000 mg | ORAL_CAPSULE | Freq: Every day | ORAL | Status: DC
  Administered 2019-01-28 – 2019-02-01 (×5): 20 mg via ORAL
  Filled 2019-01-28 (×2): qty 1
  Filled 2019-01-28: qty 14
  Filled 2019-01-28 (×4): qty 1

## 2019-01-28 MED ORDER — RISPERIDONE 2 MG PO TABS
2.0000 mg | ORAL_TABLET | Freq: Two times a day (BID) | ORAL | Status: DC
  Filled 2019-01-28 (×2): qty 1

## 2019-01-28 MED ORDER — TEMAZEPAM 15 MG PO CAPS
30.0000 mg | ORAL_CAPSULE | Freq: Every day | ORAL | Status: DC
  Administered 2019-01-30 – 2019-01-31 (×2): 30 mg via ORAL
  Filled 2019-01-28 (×2): qty 2

## 2019-01-28 NOTE — Progress Notes (Signed)
Psychoeducational Group Note  Date:  01/28/2019 Time:  2048  Group Topic/Focus:  Wrap-Up Group:   The focus of this group is to help patients review their daily goal of treatment and discuss progress on daily workbooks.  Participation Level: Did Not Attend  Participation Quality:  Not Applicable  Affect:  Not Applicable  Cognitive:  Not Applicable  Insight:  Not Applicable  Engagement in Group: Not Applicable  Additional Comments:  The patient did not attend group this evening since he was asleep in his bedroom at that time.   Hazle Coca S 01/28/2019, 8:48 PM

## 2019-01-28 NOTE — [facility] Group Notes (Signed)
[facility] Group Notes:  (Nursing/MHT/Case Management/Adjunct)  Date:  01/28/2019  Time:  4:00 PM  Type of Therapy:  Nurse Education  Participation Level:  Active  Participation Quality:  Appropriate and Attentive  Affect:  Appropriate  Cognitive:  Alert and Appropriate  Insight:  Appropriate  Engagement in Group:  Engaged and Improving  Modes of Intervention:  Discussion, Education and Exploration  Summary of Progress/Problems:  pt's discussed coping mechanisms and ways to utilize these skills when they leave.  Mark Hebert 01/28/2019, 5:23 PM
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
## 2019-01-28 NOTE — Progress Notes (Signed)
Recreation Therapy Notes  Date: 4.2.20 Time:  1000 Location: 500 Hall Dayroom  Group Topic: Communication, Team Building, Problem Solving  Goal Area(s) Addresses:  Patient will effectively work with peer towards shared goal.  Patient will identify skills used to make activity successful.  Patient will identify how skills used during activity can be used to reach post d/c goals.   Intervention: STEM Activity  Activity: Landing Pad. In teams patients were given 12 plastic drinking straws and Hebert length of masking tape. Using the materials provided patients were asked to build Hebert landing pad to catch Hebert golf ball dropped from approximately 6 feet in the air.   Education: Social Skills, Discharge Planning   Education Outcome: Acknowledges education/In group clarification offered/Needs additional education.   Clinical Observations/Feedback:  Pt did not attend group.    Mark Hebert, LRT/CTRS         Mark Hebert 01/28/2019 11:24 AM 

## 2019-01-28 NOTE — Plan of Care (Signed)
Progress note  D: pt found in bed; compliant with medication administration. Pt denies any physical pain, rating this a 0/10. Pt denies any physical symptoms, but is visually anxious about being discharged. Pt denies si/hi/ah/vh and verbally agrees to approach staff if these become apparent or before harming herself/others while at [facility].  A: pt provided support and encouragement. Pt given medication per protocol and standing orders. Q7m safety checks implemented and continued.  R: pt safe on the unit. Will continue to monitor.   Pt progressing in the following metrics  Problem: Education: Goal: Knowledge of [place] [facility] Education information/materials will improve Outcome: Progressing Goal: Emotional status will improve Outcome: Progressing Goal: Mental status will improve Outcome: Progressing Goal: Verbalization of understanding the information provided will improve Outcome: Progressing

## 2019-01-28 NOTE — Progress Notes (Signed)
D:  Mark Hebert was mainly isolative to his room until his roommate was yelling and screaming at staff.  Mark Hebert came to staff feeling paranoid and scared to be on the unit.  He requested room change which was granted. He denied SI/HI or A/V hallucinations.  He denied any pain or discomfort and appeared to be in no physical distress.  He took hs medications without difficulty.  He appeared to rest well during the night. A:  1:1 with RN for support and encouragement.  Medications as ordered.  Q 15 minute checks maintained for safety.  Encouraged participation in group and unit activities.   R:  Mark Hebert remains safe on the unit.  We will continue to monitor the progress towards his goals.
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
## 2019-01-28 NOTE — [facility] Group Notes (Signed)
[facility] LCSW Group Therapy Note  Date/Time: 01/28/19, 1315  Type of Therapy/Topic:  Group Therapy:  Balance in Life  Participation Level:  moderate  Description of Group:    This group will address the concept of balance and how it feels and looks when one is unbalanced. Patients will be encouraged to process areas in their lives that are out of balance, and identify reasons for remaining unbalanced. Facilitators will guide patients utilizing problem- solving interventions to address and correct the stressor making their life unbalanced. Understanding and applying boundaries will be explored and addressed for obtaining  and maintaining a balanced life. Patients will be encouraged to explore ways to assertively make their unbalanced needs known to significant others in their lives, using other group members and facilitator for support and feedback.  Therapeutic Goals: 1. Patient will identify two or more emotions or situations they have that consume much of in their lives. 2. Patient will identify signs/triggers that life has become out of balance:  3. Patient will identify two ways to set boundaries in order to achieve balance in their lives:  4. Patient will demonstrate ability to communicate their needs through discussion and/or role plays  Summary of Patient Progress:Pt reported that financial, work, and friends are current areas that are out of balance in his life.  Pt was attentive in group and made several comments.  Good participation overall.          Therapeutic Modalities:   Cognitive Behavioral Therapy Solution-Focused Therapy Assertiveness Training  [place], [place]

## 2019-01-28 NOTE — Progress Notes (Signed)
Recreation Therapy Notes  4.2.20 1220:  LRT talked with patient and attempted to complete recreation therapy assessment.  Pt declined to complete assessment.       Caroll Rancher, LRT/CTRS     Caroll Rancher A 01/28/2019 1:07 PM
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
## 2019-01-29 NOTE — Plan of Care (Signed)
  Problem: Coping: Goal: Ability to verbalize frustrations and anger appropriately will improve Outcome: Progressing   D: Pt alert and oriented on the unit. Pt engaging with other pts in the dayroom while watching television. Pt denies SI/HI, A/VH, and denies any pain. Pt also participated during unit groups and activities. Pt is pleasant and cooperative. A: Education, support and encouragement provided, q15 minute safety checks remain in effect. Medications administered per MD orders. R: No reactions/side effects to medicine noted. Pt denies any concerns at this time, and verbally contracts for safety. Pt ambulating on the unit with no issues. Pt remains safe on and off the unit.

## 2019-01-29 NOTE — Tx Team (Signed)
Interdisciplinary Treatment and Diagnostic Plan Update  01/29/2019 Time of Session: 0901 Mark Hebert MRN: [id number]  Principal Diagnosis: <principal problem not specified>  Secondary Diagnoses: Active Problems:   Substance induced mood disorder (HCC)   Current Medications:  Current Facility-Administered Medications  Medication Dose Route Frequency Provider Last Rate Last Dose  . acetaminophen (TYLENOL) tablet 650 mg  650 mg Oral Q6H PRN Laveda Abbe, NP      . alum & mag hydroxide-simeth (MAALOX/MYLANTA) 200-200-20 MG/5ML suspension 30 mL  30 mL Oral Q6H PRN Laveda Abbe, NP      . benztropine (COGENTIN) tablet 0.5 mg  0.5 mg Oral BID Malvin Johns, MD   0.5 mg at 01/29/19 7341  . FLUoxetine (PROZAC) capsule 20 mg  20 mg Oral Daily Malvin Johns, MD   20 mg at 01/29/19 9379  . gabapentin (NEURONTIN) capsule 300 mg  300 mg Oral TID Malvin Johns, MD   300 mg at 01/29/19 1158  . hydrOXYzine (ATARAX/VISTARIL) tablet 25 mg  25 mg Oral TID PRN Laveda Abbe, NP   25 mg at 01/28/19 1111  . ibuprofen (ADVIL,MOTRIN) tablet 600 mg  600 mg Oral Q8H PRN Laveda Abbe, NP      . magnesium hydroxide (MILK OF MAGNESIA) suspension 30 mL  30 mL Oral Daily PRN Laveda Abbe, NP      . nicotine (NICODERM CQ - dosed in mg/24 hours) patch 21 mg  21 mg Transdermal Daily Laveda Abbe, NP   21 mg at 01/29/19 0810  . ondansetron (ZOFRAN) tablet 4 mg  4 mg Oral Q8H PRN Laveda Abbe, NP      . risperiDONE (RISPERDAL) tablet 3 mg  3 mg Oral BID Malvin Johns, MD   3 mg at 01/29/19 0240  . temazepam (RESTORIL) capsule 30 mg  30 mg Oral QHS Malvin Johns, MD      . traZODone (DESYREL) tablet 50 mg  50 mg Oral QHS PRN Laveda Abbe, NP   50 mg at 01/27/19 2203   PTA Medications: Medications Prior to Admission  Medication Sig Dispense Refill Last Dose  . citalopram (CELEXA) 20 MG tablet Take 1 tablet (20 mg total) by mouth daily with breakfast. 30 tablet  0 unknown  . ibuprofen (ADVIL,MOTRIN) 200 MG tablet Take 400 mg by mouth every 6 (six) hours as needed for headache, mild pain or moderate pain.   unknown  . risperiDONE (RISPERDAL) 1 MG tablet Take 1 tablet (1 mg total) by mouth 2 (two) times daily. 30 tablet 0 unknown    Patient Stressors: Marital or family conflict Substance abuse  Patient Strengths: Ability for insight Average or above average intelligence Capable of independent living General fund of knowledge Supportive family/friends  Treatment Modalities: Medication Management, Group therapy, Case management,  1 to 1 session with clinician, Psychoeducation, Recreational therapy.   Physician Treatment Plan for Primary Diagnosis: <principal problem not specified> Long Term Goal(s): Improvement in symptoms so as ready for discharge Improvement in symptoms so as ready for discharge   Short Term Goals: Ability to identify and develop effective coping behaviors will improve Ability to maintain clinical measurements within normal limits will improve Compliance with prescribed medications will improve  Medication Management: Evaluate patient's response, side effects, and tolerance of medication regimen.  Therapeutic Interventions: 1 to 1 sessions, Unit Group sessions and Medication administration.  Evaluation of Outcomes: Progressing  Physician Treatment Plan for Secondary Diagnosis: Active Problems:   Substance induced mood disorder (HCC)  Long Term Goal(s): Improvement in symptoms so as ready for discharge Improvement in symptoms so as ready for discharge   Short Term Goals: Ability to identify and develop effective coping behaviors will improve Ability to maintain clinical measurements within normal limits will improve Compliance with prescribed medications will improve     Medication Management: Evaluate patient's response, side effects, and tolerance of medication regimen.  Therapeutic Interventions: 1 to 1 sessions,  Unit Group sessions and Medication administration.  Evaluation of Outcomes: Progressing   RN Treatment Plan for Primary Diagnosis: <principal problem not specified> Long Term Goal(s): Knowledge of disease and therapeutic regimen to maintain health will improve  Short Term Goals: Ability to identify and develop effective coping behaviors will improve and Compliance with prescribed medications will improve  Medication Management: RN will administer medications as ordered by provider, will assess and evaluate patient's response and provide education to patient for prescribed medication. RN will report any adverse and/or side effects to prescribing provider.  Therapeutic Interventions: 1 on 1 counseling sessions, Psychoeducation, Medication administration, Evaluate responses to treatment, Monitor vital signs and CBGs as ordered, Perform/monitor CIWA, COWS, AIMS and Fall Risk screenings as ordered, Perform wound care treatments as ordered.  Evaluation of Outcomes: Progressing   LCSW Treatment Plan for Primary Diagnosis: <principal problem not specified> Long Term Goal(s): Safe transition to appropriate next level of care at discharge, Engage patient in therapeutic group addressing interpersonal concerns.  Short Term Goals: Engage patient in aftercare planning with referrals and resources, Increase social support and Increase skills for wellness and recovery  Therapeutic Interventions: Assess for all discharge needs, 1 to 1 time with Social worker, Explore available resources and support systems, Assess for adequacy in community support network, Educate family and significant other(s) on suicide prevention, Complete Psychosocial Assessment, Interpersonal group therapy.  Evaluation of Outcomes: Progressing   Progress in Treatment: Attending groups: Yes. Participating in groups: Yes. Taking medication as prescribed: Yes. Toleration medication: Yes. Family/Significant other contact made: No,  will contact:  mother Patient understands diagnosis: Yes. Discussing patient identified problems/goals with staff: Yes. Medical problems stabilized or resolved: Yes. Denies suicidal/homicidal ideation: Yes. Issues/concerns per patient self-inventory: No. Other: none  New problem(s) identified: No, Describe:  none  New Short Term/Long Term Goal(s):  Patient Goals:  "start on meds"  Discharge Plan or Barriers:   Reason for Continuation of Hospitalization: Depression Medication stabilization Other; describe na  Estimated Length of Stay:3-5 days  Attendees: Patient:Mark Hebert 01/29/2019   Physician: Dr. Jeannine Kitten, MD 01/29/2019   Nursing: Dossie Arbour, RN 01/29/2019   RN Care Manager: 01/29/2019   Social Worker: Daleen Squibb, LCSW 01/29/2019   Recreational Therapist:  01/29/2019   Other:  01/29/2019   Other:  01/29/2019   Other: 01/29/2019     Scribe for Treatment Team: Lorri Frederick, LCSW 01/29/2019 2:00 PM

## 2019-01-29 NOTE — Plan of Care (Signed)
  Problem: Activity: Goal: Interest or engagement in activities will improve Outcome: Progressing Goal: Sleeping patterns will improve Outcome: Progressing   

## 2019-01-29 NOTE — Progress Notes (Signed)
Recreation Therapy Notes  Date: 4.3.20 Time: 1000 Location: 500 Hall Dayroom  Group Topic:  Goal Setting  Goal Area(s) Addresses:  Patient will be able to identify at least 3 goals.  Patient will be able to identify benefit of investing in goals.  Patient will be able to identify benefit of setting goals.   Behavioral Response:  Engaged  Intervention: Worksheet, pencils, music  Activity: Goal Planning.  Patients were to identify goals they wanted to accomplish in a week, month, year and five years.  Patients were to then identify obstacles that would prevent them from reaching goals, what they need to reach their goals and what they can start doing now to work towards goals.  Education:  Discharge Planning, Pharmacologist, Leisure Education   Education Outcome: Acknowledges Education/In Group Clarification Provided/Needs Additional Education  Clinical Observations:  Pt expressed he wanted to go home in the next week; get medical insurance in the next month; get teeth fixed in the next year; and in five years have a bed and home of his own.  Pt explained his main obstacle was being broke; needs income to achieve goals and expressed he could start working on goals now by being discharged.  Pt appeared drowsy but was able to complete sheet.  Pt also requested 2 songs he wanted to hear during group.    Caroll Rancher, LRT/CTRS      Caroll Rancher A 01/29/2019 12:05 PM

## 2019-01-29 NOTE — Progress Notes (Signed)
D:  Mark Hebert was asleep all evening.  He did not get up for evening wrap up group.  He did wake up about 230am complaining of chest pain.  VSS.  He stated it felt more like gas and ginger ale was provided.  He was able to belch and stated the pain was gone.  He was given a small snack and he return to his room.  He denied SI/HI or A/V hallucinations.  He is currently resting quietly with his eyes closed and appears to e asleep. A:  1:1 with RN for support and encouragement.  Medications as ordered.  Q 15 minute checks maintained for safety.  Encouraged participation in group and unit activities.   R:  Mark Hebert remains safe on the unit.  We will continue to monitor the progress towards his goals.

## 2019-01-29 NOTE — [facility] Group Notes (Signed)
Date: 01/29/19, 1330  Type of Therapy and Topic: Chaplain group, "Hope is.." Chaplain engaged group in discussion about hope and what it looks like in each patients life and current situation.  Participation level:minimal  Modes of Intervention: Discussion, Education and Socialization  Summary of Progress/Problems:Pt mostly quiet during group but did respond when asked for comments by the chaplain, said that "Hope is..." reminded him of the Star Wars movies.     Lorri Frederick, LCSW   [facility] LCSW Group Therapy Note
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
## 2019-01-29 NOTE — Progress Notes (Signed)
Adult Psychoeducational Group Note  Date:  01/29/2019 Time:  4:48 PM  Group Topic/Focus:  Recovery Goals:   The focus of this group is to identify appropriate goals for recovery and establish a plan to achieve them.  Participation Level:  Minimal  Participation Quality:  Appropriate  Affect:  Flat  Cognitive:  Appropriate  Insight: Improving  Engagement in Group:  Improving and Limited  Modes of Intervention:  Discussion and Education  Additional Comments: Pt was concern about how soon he would be getting discharge. He was reminded to speak with the MD about his possible discharge date.  Eligio Angert E 01/29/2019, 4:48 PM

## 2019-01-29 NOTE — [facility] Group Notes (Signed)
The focus of this group is to help patients establish daily goals to achieve during treatment and discuss how the patient can incorporate goal setting into their daily lives to aide in recovery.   Pt attended Goals group, he stated that he wants to talk with the Dr. To get a better idea of what he is doing (His treatment plan)

## 2019-01-29 NOTE — Progress Notes (Signed)
[facility] MD Progress Note  01/29/2019 10:16 AM Mark Hebert  MRN:  [id number] Subjective:    Patient is generally compliant although resistant to the overall concept of psychiatric hospitalization, insisting he should be discharged, that there is "nothing wrong with him" and that he only used methamphetamines once. His mother is very fearful for his short-term and long-term safety because of the drug use, and it is certainly more extensive than the patient elaborates.  He is also had intense paranoia lately that was drug-induced, strong family history of mental illness and suicide in an uncle. However the positives are the patient is cooperating he thinks the medications are helpful and does not desire changes in medications.  Denies wanting to harm self or others.  Principal Problem: Drug-induced psychosis/drug-induced mood disorder Diagnosis: Active Problems:   Substance induced mood disorder (HCC)  Total Time spent with patient: 20 minutes  Past Medical History:  Past Medical History:  Diagnosis Date  . Headache(784.0)    states he gets sometimes  . Obesity   . Post traumatic stress disorder   . PTSD (post-traumatic stress disorder)     Past Surgical History:  Procedure Laterality Date  . APPENDECTOMY  Nov 2012   Family History:  Family History  Problem Relation Age of Onset  . Bipolar disorder Mother     Social History:  Social History   Substance and Sexual Activity  Alcohol Use Yes     Social History   Substance and Sexual Activity  Drug Use Yes  . Frequency: 2.0 times per week  . Types: Marijuana, Amphetamines, Methamphetamines   Comment: last use September 25, 2011    Social History   Socioeconomic History  . Marital status: Single    Spouse name: Not on file  . Number of children: Not on file  . Years of education: Not on file  . Highest education level: Not on file  Occupational History  . 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 Financial resource strain: Not on file  . Food insecurity:    Worry: Not on file    Inability: Not on file  . Transportation needs:    Medical: Not on file    Non-medical: Not on file  Tobacco Use  . Smoking status: Current Every Day Smoker    Packs/day: 0.25    Years: 5.00    Pack years: 1.25    Types: Cigarettes, Cigars  . Smokeless tobacco: Never Used  . Tobacco comment: would like nicotine patches  Substance and Sexual Activity  . Alcohol use: Yes  . Drug use: Yes    Frequency: 2.0 times per week    Types: Marijuana, Amphetamines, Methamphetamines    Comment: last use September 25, 2011  . Sexual activity: Yes  Lifestyle  . Physical activity:    Days per week: Not on file    Minutes per session: Not on file  . Stress: Not on file  Relationships  . Social connections:    Talks on phone: Not on file    Gets together: Not on file    Attends religious service: Not on file    Active member of club or organization: Not on file    Attends meetings of clubs or organizations: Not on file    Relationship status: Not on file  Other Topics Concern  . Not on file  Social History Narrative  . Not on file   Additional Social History:  Sleep: Fair  Appetite:  Fair  Current Medications: Current Facility-Administered Medications  Medication Dose Route Frequency Provider Last Rate Last Dose  . acetaminophen (TYLENOL) tablet 650 mg  650 mg Oral Q6H PRN Laveda Abbe, NP      . alum & mag hydroxide-simeth (MAALOX/MYLANTA) 200-200-20 MG/5ML suspension 30 mL  30 mL Oral Q6H PRN Laveda Abbe, NP      . benztropine (COGENTIN) tablet 0.5 mg  0.5 mg Oral BID Malvin Johns, MD   0.5 mg at 01/29/19 0737  . FLUoxetine (PROZAC) capsule 20 mg  20 mg Oral Daily Malvin Johns, MD   20 mg at 01/29/19 1062  . gabapentin (NEURONTIN) capsule 300 mg  300 mg Oral TID Malvin Johns, MD   300 mg at 01/29/19 6948  . hydrOXYzine (ATARAX/VISTARIL)  tablet 25 mg  25 mg Oral TID PRN Laveda Abbe, NP   25 mg at 01/28/19 1111  . ibuprofen (ADVIL,MOTRIN) tablet 600 mg  600 mg Oral Q8H PRN Laveda Abbe, NP      . magnesium hydroxide (MILK OF MAGNESIA) suspension 30 mL  30 mL Oral Daily PRN Laveda Abbe, NP      . nicotine (NICODERM CQ - dosed in mg/24 hours) patch 21 mg  21 mg Transdermal Daily Laveda Abbe, NP   21 mg at 01/29/19 0810  . ondansetron (ZOFRAN) tablet 4 mg  4 mg Oral Q8H PRN Laveda Abbe, NP      . risperiDONE (RISPERDAL) tablet 3 mg  3 mg Oral BID Malvin Johns, MD   3 mg at 01/29/19 5462  . temazepam (RESTORIL) capsule 30 mg  30 mg Oral QHS Malvin Johns, MD      . traZODone (DESYREL) tablet 50 mg  50 mg Oral QHS PRN Laveda Abbe, NP   50 mg at 01/27/19 2203    Lab Results: No results found for this or any previous visit (from the past 48 hour(s)).  Blood Alcohol level:  Lab Results  Component Value Date   ETH <10 01/26/2019   ETH 133 (H) 01/09/2019    Metabolic Disorder Labs: No results found for: HGBA1C, MPG Lab Results  Component Value Date   PROLACTIN 4.2 11/10/2011   No results found for: CHOL, TRIG, HDL, CHOLHDL, VLDL, LDLCALC  Physical Findings: AIMS: Facial and Oral Movements Muscles of Facial Expression: None, normal Lips and Perioral Area: None, normal Jaw: None, normal Tongue: None, normal,Extremity Movements Upper (arms, wrists, hands, fingers): None, normal Lower (legs, knees, ankles, toes): None, normal, Trunk Movements Neck, shoulders, hips: None, normal, Overall Severity Severity of abnormal movements (highest score from questions above): None, normal Incapacitation due to abnormal movements: None, normal Patient's awareness of abnormal movements (rate only patient's report): No Awareness, Dental Status Current problems with teeth and/or dentures?: No Does patient usually wear dentures?: No  CIWA:    COWS:     Musculoskeletal: Strength &  Muscle Tone: within normal limits Gait & Station: normal Patient leans: N/A  Psychiatric Specialty Exam: Physical Exam  ROS  Blood pressure (!) 125/91, pulse 75, temperature 97.6 F (36.4 C), temperature source Oral, resp. rate 20, height 6\' 3"  (1.905 m), weight 70.3 kg.Body mass index is 19.37 kg/m.  General Appearance: Disheveled  Eye Contact:  Fair  Speech:  Clear and Coherent  Volume:  Increased  Mood:  Anxious  Affect:  Appropriate  Thought Process:  Descriptions of Associations: Tangential  Orientation:  Full (Time, Place, and Person)  Thought  Content:  Tangential  Suicidal Thoughts:  No  Homicidal Thoughts:  No  Memory:  Immediate;   Fair  Judgement:  Fair  Insight:  Fair  Psychomotor Activity:  Normal  Concentration:  Concentration: Good  Recall:  Fair  Fund of Knowledge:  Fair  Language:  Fair  Akathisia:  Negative  Handed:  Right  AIMS (if indicated):     Assets:  Housing Physical Health Resilience Social Support  ADL's:  Intact  Cognition:  WNL  Sleep:  Number of Hours: 5.75     Treatment Plan Summary: Daily contact with patient to assess and evaluate symptoms and progress in treatment, Medication management and Plan Continue cognitive and rehab based therapies continue reality-based therapies no recurrence of psychosis meds are adjusted again there is a difference of opinion regarding what his mother would like for him to have long-term and even short-term versus the patient, he lobbyist for discharge insisting nothing is wrong she argues for some type of supervision even group home placement which would have to be voluntary of course.  Malvin Johns, MD 01/29/2019, 10:16 AM

## 2019-01-30 NOTE — [facility] Group Notes (Signed)
  [facility] LCSW Group Therapy Note  Date/Time:  01/30/2019 11:15AM-12:00PM  Type of Therapy and Topic:  Group Therapy:  Feelings About Hospitalization  Participation Level:  Active   Description of Group This process group involved patients discussing their feelings related to being hospitalized, as well as the benefits they see to being in the hospital.  These feelings and benefits were itemized.  The group then brainstormed specific ways in which they could seek those same benefits when they discharge and return home.  A special emphasis was placed on the current pandemic issue and the need for social distancing in the midst of taking care of one's needs.  Therapeutic Goals 1. Patient will identify and describe positive and negative feelings related to hospitalization 2. Patient will verbalize benefits of hospitalization to themselves personally 3. Patients will brainstorm together ways they can obtain similar benefits in the outpatient setting, identify barriers to wellness and possible solutions  Summary of Patient Progress:  The patient expressed his primary feelings about being hospitalized are not good, and he plans to never be hospitalized again by never going to a hospital again.  He stated that he intends, however, to stay on his medications, "and I'm not just saying that."  Therapeutic Modalities Cognitive Behavioral Therapy Motivational Interviewing    Ambrose Mantle, LCSW 01/30/2019, 5:24 PM

## 2019-01-30 NOTE — Plan of Care (Signed)
Progress note  D: pt found in the hallway interacting; compliant with medication administration. Pt states he slept well. Pt rates his depression/hopelessness/anxiety a 0/0/0 out of 10 respectively. Pt denies any physical problems, but does complain of his back being sore. Pt states his for today is to be released and he will achieve this by asking to be discharged. Pt denies si/hi/ah/vh and verbally agrees to approach staff if these become apparent or before harming himself/others while at [facility].  A: pt provided support and encouragement. Pt given medication per protocol and standing orders. Q22m safety checks implemented and continued.  R: pt safe on the unit. Will continue to monitor.   Pt progressing in the following metrics  Problem: Coping: Goal: Ability to demonstrate self-control will improve Outcome: Progressing   Problem: Health Behavior/Discharge Planning: Goal: Identification of resources available to assist in meeting health care needs will improve Outcome: Progressing Goal: Compliance with treatment plan for underlying cause of condition will improve Outcome: Progressing   Problem: Physical Regulation: Goal: Ability to maintain clinical measurements within normal limits will improve Outcome: Progressing

## 2019-01-30 NOTE — Progress Notes (Signed)
Patient has been in bed asleep since shift change. Writer did not wake patient to give scheduled dose of Restoril. No distress noted, safety maintained on unit with 15 min checks. Will continue to monitor.

## 2019-01-30 NOTE — Progress Notes (Signed)
[facility] MD Progress Note  01/30/2019 10:00 AM Dequincy Clemensen  MRN:  [id number] Subjective:   Patient is cooperative and he believes that the medications are helpful and he is responding well to them he believes his mood is more stable.  Actually he is showing good insight as follows when I confronted him directly with the fact that his version of events and the seriousness of his situation is minimized by himself, such as stating he only used methamphetamine 1 time, his mother and family members report contradictory information, he states "my family is right" and he does agree that he needs continue treatment he agrees to stay through the weekend of course but he is still not eager to take on any type of rehab that is inpatient based Involuntary movements no TD no cravings tremors or withdrawal Principal Problem: Methamphetamine induced psychosis Diagnosis: Active Problems:   Substance induced mood disorder (HCC)  Total Time spent with patient: 20 minutes  Past Medical History:  Past Medical History:  Diagnosis Date  . Headache(784.0)    states he gets sometimes  . Obesity   . Post traumatic stress disorder   . PTSD (post-traumatic stress disorder)     Past Surgical History:  Procedure Laterality Date  . APPENDECTOMY  Nov 2012   Family History:  Family History  Problem Relation Age of Onset  . Bipolar disorder Mother     Social History:  Social History   Substance and Sexual Activity  Alcohol Use Yes     Social History   Substance and Sexual Activity  Drug Use Yes  . Frequency: 2.0 times per week  . Types: Marijuana, Amphetamines, Methamphetamines   Comment: last use September 25, 2011    Social History   Socioeconomic History  . Marital status: Single    Spouse name: Not on file  . Number of children: Not on file  . Years of education: Not on file  . Highest education level: Not on file  Occupational History  . 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 Financial resource strain: Not on file  . Food insecurity:    Worry: Not on file    Inability: Not on file  . Transportation needs:    Medical: Not on file    Non-medical: Not on file  Tobacco Use  . Smoking status: Current Every Day Smoker    Packs/day: 0.25    Years: 5.00    Pack years: 1.25    Types: Cigarettes, Cigars  . Smokeless tobacco: Never Used  . Tobacco comment: would like nicotine patches  Substance and Sexual Activity  . Alcohol use: Yes  . Drug use: Yes    Frequency: 2.0 times per week    Types: Marijuana, Amphetamines, Methamphetamines    Comment: last use September 25, 2011  . Sexual activity: Yes  Lifestyle  . Physical activity:    Days per week: Not on file    Minutes per session: Not on file  . Stress: Not on file  Relationships  . Social connections:    Talks on phone: Not on file    Gets together: Not on file    Attends religious service: Not on file    Active member of club or organization: Not on file    Attends meetings of clubs or organizations: Not on file    Relationship status: Not on file  Other Topics Concern  . Not on file  Social History  Narrative  . Not on file   Additional Social History:                         Sleep: Fair  Appetite:  Fair  Current Medications: Current Facility-Administered Medications  Medication Dose Route Frequency Provider Last Rate Last Dose  . acetaminophen (TYLENOL) tablet 650 mg  650 mg Oral Q6H PRN Laveda Abbe, NP      . alum & mag hydroxide-simeth (MAALOX/MYLANTA) 200-200-20 MG/5ML suspension 30 mL  30 mL Oral Q6H PRN Laveda Abbe, NP      . benztropine (COGENTIN) tablet 0.5 mg  0.5 mg Oral BID Malvin Johns, MD   0.5 mg at 01/30/19 0740  . FLUoxetine (PROZAC) capsule 20 mg  20 mg Oral Daily Malvin Johns, MD   20 mg at 01/30/19 0740  . gabapentin (NEURONTIN) capsule 300 mg  300 mg Oral TID Malvin Johns, MD   300 mg at 01/30/19 0740  . hydrOXYzine  (ATARAX/VISTARIL) tablet 25 mg  25 mg Oral TID PRN Laveda Abbe, NP   25 mg at 01/28/19 1111  . ibuprofen (ADVIL,MOTRIN) tablet 600 mg  600 mg Oral Q8H PRN Laveda Abbe, NP      . magnesium hydroxide (MILK OF MAGNESIA) suspension 30 mL  30 mL Oral Daily PRN Laveda Abbe, NP      . nicotine (NICODERM CQ - dosed in mg/24 hours) patch 21 mg  21 mg Transdermal Daily Laveda Abbe, NP   21 mg at 01/30/19 0740  . ondansetron (ZOFRAN) tablet 4 mg  4 mg Oral Q8H PRN Laveda Abbe, NP      . risperiDONE (RISPERDAL) tablet 3 mg  3 mg Oral BID Malvin Johns, MD   3 mg at 01/30/19 0740  . temazepam (RESTORIL) capsule 30 mg  30 mg Oral QHS Malvin Johns, MD      . traZODone (DESYREL) tablet 50 mg  50 mg Oral QHS PRN Laveda Abbe, NP   50 mg at 01/27/19 2203    Lab Results: No results found for this or any previous visit (from the past 48 hour(s)).  Blood Alcohol level:  Lab Results  Component Value Date   ETH <10 01/26/2019   ETH 133 (H) 01/09/2019    Metabolic Disorder Labs: No results found for: HGBA1C, MPG Lab Results  Component Value Date   PROLACTIN 4.2 11/10/2011   No results found for: CHOL, TRIG, HDL, CHOLHDL, VLDL, LDLCALC  Physical Findings: AIMS: Facial and Oral Movements Muscles of Facial Expression: None, normal Lips and Perioral Area: None, normal Jaw: None, normal Tongue: None, normal,Extremity Movements Upper (arms, wrists, hands, fingers): None, normal Lower (legs, knees, ankles, toes): None, normal, Trunk Movements Neck, shoulders, hips: None, normal, Overall Severity Severity of abnormal movements (highest score from questions above): None, normal Incapacitation due to abnormal movements: None, normal Patient's awareness of abnormal movements (rate only patient's report): No Awareness, Dental Status Current problems with teeth and/or dentures?: No Does patient usually wear dentures?: No  CIWA:    COWS:      Musculoskeletal: Strength & Muscle Tone: within normal limits Gait & Station: normal Patient leans: N/A  Psychiatric Specialty Exam: Physical Exam  ROS  Blood pressure 132/78, pulse 84, temperature (!) 97.3 F (36.3 C), resp. rate 20, height 6\' 3"  (1.905 m), weight 70.3 kg.Body mass index is 19.37 kg/m.  General Appearance: Casual  Eye Contact:  Good  Speech:  Clear  and Coherent  Volume:  Decreased  Mood:  Anxious and Dysphoric  Affect:  Appropriate  Thought Process:  Coherent  Orientation:  Full (Time, Place, and Person)  Thought Content:  Logical  Suicidal Thoughts:  No  Homicidal Thoughts:  No  Memory:  Immediate;   Good  Judgement:  Good  Insight:  Good  Psychomotor Activity:  Normal  Concentration:  Concentration: Good  Recall:  Fair  Fund of Knowledge:  Good  Language:  Good  Akathisia:  Negative  Handed:  Right  AIMS (if indicated):     Assets:  Leisure Time Physical Health Resilience  ADL's:  Intact  Cognition:  WNL  Sleep:  Number of Hours: 6.75     Treatment Plan Summary: Daily contact with patient to assess and evaluate symptoms and progress in treatment, Medication management and Plan Continue current rehab and cognitive based therapies continue current measures without change in meds or precautions  Lorrayne Ismael, MD 01/30/2019, 10:00 AM

## 2019-01-30 NOTE — [facility] Group Notes (Signed)
[facility] Group Notes:  (Nursing/MHT/Case Management/Adjunct)  Date:  01/30/2019  Time:  4:00 PM  Type of Therapy:  Nurse Education  Participation Level:  Active  Participation Quality:  Appropriate and Attentive  Affect:  Appropriate  Cognitive:  Alert and Appropriate  Insight:  Appropriate  Engagement in Group:  Engaged  Modes of Intervention:  Discussion, Education and Exploration  Summary of Progress/Problems: pt's discussed anger and coping mechanisms to utilize in their plan of care.   Mark Hebert 01/30/2019, 5:21 PM

## 2019-01-30 NOTE — Progress Notes (Signed)
Patient has been up in the dayroom watching star wars with peers. He reports having had a good day and hopeful to go home soon. He voiced no complaints. Patient was informed of scheduled medication.  Patient currently denies having pain, -si/hi/a/v hall. Support and encouragement offered, safety maintained on unit, will continue to monitor.

## 2019-01-31 DIAGNOSIS — F15159 Other stimulant abuse with stimulant-induced psychotic disorder, unspecified: Principal | ICD-10-CM

## 2019-01-31 DIAGNOSIS — F17290 Nicotine dependence, other tobacco product, uncomplicated: Secondary | ICD-10-CM

## 2019-01-31 NOTE — Progress Notes (Addendum)
[facility] MD Progress Note  01/31/2019 10:00 AM Mark Hebert  MRN:  [id number]   Subjective: [name] stated " I need to leave soon, if not I need a sleeping pill to help me stay here another day."   Evaluation: Striker observed sitting in dayroom interacting with peers.  Patient currently denying suicidal or homicidal ideations.  Does report suicidal attempts in the past however states it is "embarrassing to talk about".  Patient appears sedated with slow responses.  States he has plans to live with his mother however states he will be unable to use drugs or alcohol.  Patient appears to be minimizing symptoms of depression and/or anxiety as he reports most of his friends are dead.  Stated his uncle committed suicide. (Tattooed initials of uncles name noted on right hand.)  Patient reports taking medication prescribed denies medication side effect.  Support, encouragement reassurance was provided.  History: per admission assessment note: Mr. [name] is 23 years of age he reports this is his first psychiatric admission as an adult, there is a history of past abuse, chronic cannabis abuse and recent methamphetamine abuse, a family history of suicide in the uncle, and what prompted this admission under petition for involuntary commitment was an attempt to kill himself by putting a plug and iron in the bathtub with himself.    Principal Problem: Methamphetamine induced psychosis Diagnosis: Active Problems:   Substance induced mood disorder (HCC)  Total Time spent with patient: 20 minutes  Past Medical History:  Past Medical History:  Diagnosis Date  . Headache(784.0)    states he gets sometimes  . Obesity   . Post traumatic stress disorder   . PTSD (post-traumatic stress disorder)     Past Surgical History:  Procedure Laterality Date  . APPENDECTOMY  Nov 2012   Family History:  Family History  Problem Relation Age of Onset  . Bipolar disorder Mother     Social History:  Social History   Substance  and Sexual Activity  Alcohol Use Yes     Social History   Substance and Sexual Activity  Drug Use Yes  . Frequency: 2.0 times per week  . Types: Marijuana, Amphetamines, Methamphetamines   Comment: last use September 25, 2011    Social History   Socioeconomic History  . Marital status: Single    Spouse name: Not on file  . Number of children: Not on file  . Years of education: Not on file  . Highest education level: Not on file  Occupational History  . Occupation: Consulting civil engineer    Comment: 9th grade at [employer]  Social Needs  . Financial resource strain: Not on file  . Food insecurity:    Worry: Not on file    Inability: Not on file  . Transportation needs:    Medical: Not on file    Non-medical: Not on file  Tobacco Use  . Smoking status: Current Every Day Smoker    Packs/day: 0.25    Years: 5.00    Pack years: 1.25    Types: Cigarettes, Cigars  . Smokeless tobacco: Never Used  . Tobacco comment: would like nicotine patches  Substance and Sexual Activity  . Alcohol use: Yes  . Drug use: Yes    Frequency: 2.0 times per week    Types: Marijuana, Amphetamines, Methamphetamines    Comment: last use September 25, 2011  . Sexual activity: Yes  Lifestyle  . Physical activity:    Days per week: Not on file  Minutes per session: Not on file  . Stress: Not on file  Relationships  . Social connections:    Talks on phone: Not on file    Gets together: Not on file    Attends religious service: Not on file    Active member of club or organization: Not on file    Attends meetings of clubs or organizations: Not on file    Relationship status: Not on file  Other Topics Concern  . Not on file  Social History Narrative  . Not on file   Additional Social History:                         Sleep: Fair  Appetite:  Fair  Current Medications: Current Facility-Administered Medications  Medication Dose Route Frequency Provider Last Rate Last Dose  . acetaminophen  (TYLENOL) tablet 650 mg  650 mg Oral Q6H PRN Laveda Abbe, NP      . alum & mag hydroxide-simeth (MAALOX/MYLANTA) 200-200-20 MG/5ML suspension 30 mL  30 mL Oral Q6H PRN Laveda Abbe, NP      . benztropine (COGENTIN) tablet 0.5 mg  0.5 mg Oral BID Malvin Johns, MD   0.5 mg at 01/31/19 0802  . FLUoxetine (PROZAC) capsule 20 mg  20 mg Oral Daily Malvin Johns, MD   20 mg at 01/31/19 0802  . gabapentin (NEURONTIN) capsule 300 mg  300 mg Oral TID Malvin Johns, MD   300 mg at 01/31/19 0803  . hydrOXYzine (ATARAX/VISTARIL) tablet 25 mg  25 mg Oral TID PRN Laveda Abbe, NP   25 mg at 01/28/19 1111  . ibuprofen (ADVIL,MOTRIN) tablet 600 mg  600 mg Oral Q8H PRN Laveda Abbe, NP      . magnesium hydroxide (MILK OF MAGNESIA) suspension 30 mL  30 mL Oral Daily PRN Laveda Abbe, NP      . nicotine (NICODERM CQ - dosed in mg/24 hours) patch 21 mg  21 mg Transdermal Daily Laveda Abbe, NP   21 mg at 01/31/19 0803  . ondansetron (ZOFRAN) tablet 4 mg  4 mg Oral Q8H PRN Laveda Abbe, NP      . risperiDONE (RISPERDAL) tablet 3 mg  3 mg Oral BID Malvin Johns, MD   3 mg at 01/31/19 0802  . temazepam (RESTORIL) capsule 30 mg  30 mg Oral QHS Malvin Johns, MD   30 mg at 01/30/19 2120  . traZODone (DESYREL) tablet 50 mg  50 mg Oral QHS PRN Laveda Abbe, NP   50 mg at 01/27/19 2203    Lab Results: No results found for this or any previous visit (from the past 48 hour(s)).  Blood Alcohol level:  Lab Results  Component Value Date   ETH <10 01/26/2019   ETH 133 (H) 01/09/2019    Metabolic Disorder Labs: No results found for: HGBA1C, MPG Lab Results  Component Value Date   PROLACTIN 4.2 11/10/2011   No results found for: CHOL, TRIG, HDL, CHOLHDL, VLDL, LDLCALC  Physical Findings: AIMS: Facial and Oral Movements Muscles of Facial Expression: None, normal Lips and Perioral Area: None, normal Jaw: None, normal Tongue: None, normal,Extremity  Movements Upper (arms, wrists, hands, fingers): None, normal Lower (legs, knees, ankles, toes): None, normal, Trunk Movements Neck, shoulders, hips: None, normal, Overall Severity Severity of abnormal movements (highest score from questions above): None, normal Incapacitation due to abnormal movements: None, normal Patient's awareness of abnormal movements (rate only patient's report):  No Awareness, Dental Status Current problems with teeth and/or dentures?: No Does patient usually wear dentures?: No  CIWA:    COWS:     Musculoskeletal: Strength & Muscle Tone: within normal limits Gait & Station: normal Patient leans: N/A  Psychiatric Specialty Exam: Physical Exam  Vitals reviewed. Constitutional: He is oriented to person, place, and time. He appears well-developed.  Neurological: He is oriented to person, place, and time.  Psychiatric: He has a normal mood and affect. His behavior is normal.    Review of Systems  Psychiatric/Behavioral: Positive for depression and substance abuse. The patient is nervous/anxious.   All other systems reviewed and are negative.   Blood pressure 116/75, pulse 91, temperature 98.7 F (37.1 C), resp. rate 14, height 6\' 3"  (1.905 m), weight 70.3 kg.Body mass index is 19.37 kg/m.  General Appearance: Casual  Eye Contact:  Good  Speech:  Clear and Coherent  Volume:  Decreased  Mood:  Anxious and Dysphoric  Affect:  Appropriate  Thought Process:  Coherent  Orientation:  Full (Time, Place, and Person)  Thought Content:  Logical  Suicidal Thoughts:  No  Homicidal Thoughts:  No  Memory:  Immediate;   Good  Judgement:  Good  Insight:  Good  Psychomotor Activity:  Normal  Concentration:  Concentration: Good  Recall:  Fair  Fund of Knowledge:  Good  Language:  Good  Akathisia:  Negative  Handed:  Right  AIMS (if indicated):     Assets:  Leisure Time Physical Health Resilience  ADL's:  Intact  Cognition:  WNL  Sleep:  Number of Hours: 6.75      Treatment Plan Summary: Daily contact with patient to assess and evaluate symptoms and progress in treatment and Medication management    Continue her current treatment plan on 01/31/2019 as listed below except were noted  Substance-induced mood disorder:  Continue Resporal 3 mg p.o. twice daily Continue Cogentin 0.5 mg p.o. twice daily Continue Prozac 20 mg p.o. daily Continue Neurontin 300 mg p.o. 3 times daily Continue Restoril 30 mg p.o. nightly Continue trazodone 50 mg p.o. nightly  CSW to continue working on discharge disposition Patient encouraged to participate within the therapeutic milieu   Oneta Rack, NP 01/31/2019, 10:00 AM   Attest to NP progress note

## 2019-01-31 NOTE — Progress Notes (Signed)
Pt presents with an anxious affect. Pt reported feeling happy today and asked is there's a such thing as feeling too happy. Pt rated depression 0, anxiety 2, and hopelessness 0. Pt denied SI/HI. Pt reported sleeping well last night. Pt denied AVH. Pt denies experiencing side effects to his medications.  Medications reviewed with pt and education provided. Verbal support provided. Pt encouraged to attend groups. 15 minute checks performed for safety.   Pt compliant with tx plan. Pt stated goal "getting closer to discharge from [facility]."

## 2019-01-31 NOTE — [facility] Group Notes (Signed)
[facility] LCSW Group Therapy Note  Date/Time:  01/31/2019  11:00AM-12:00PM  Type of Therapy and Topic:  Group Therapy:  Music and Mood  Participation Level:  Minimal   Description of Group: In this process group, members listened to a variety of genres of music and identified that different types of music evoke different responses.  Patients were encouraged to identify music that was soothing for them and music that was energizing for them.  Patients discussed how this knowledge can help with wellness and recovery in various ways including managing depression and anxiety as well as encouraging healthy sleep habits.    Therapeutic Goals: 1. Patients will explore the impact of different varieties of music on mood 2. Patients will verbalize the thoughts they have when listening to different types of music 3. Patients will identify music that is soothing to them as well as music that is energizing to them 4. Patients will discuss how to use this knowledge to assist in maintaining wellness and recovery 5. Patients will explore the use of music as a coping skill  Summary of Patient Progress:  At the beginning of group, patient expressed that he felt "annoyed" and at the end of group he said he felt "contempt."  There were several songs that he stated he enjoyed, with several others that he did not like.  He left group for awhile but did return.  Therapeutic Modalities: Solution Focused Brief Therapy Activity   Ambrose Mantle, LCSW

## 2019-02-01 MED ORDER — GABAPENTIN 300 MG PO CAPS: 300.0000 mg | ORAL_CAPSULE | Freq: Three times a day (TID) | ORAL | 2 refills | Status: DC

## 2019-02-01 MED ORDER — GABAPENTIN 300 MG PO CAPS: 300.0000 mg | ORAL_CAPSULE | Freq: Three times a day (TID) | ORAL | 0 refills | Status: AC

## 2019-02-01 MED ORDER — RISPERIDONE 3 MG PO TABS: 6.0000 mg | ORAL_TABLET | Freq: Every day | ORAL | 1 refills | Status: AC

## 2019-02-01 MED ORDER — BENZTROPINE MESYLATE 0.5 MG PO TABS: 0.5000 mg | ORAL_TABLET | Freq: Two times a day (BID) | ORAL | 1 refills | Status: AC

## 2019-02-01 MED ORDER — RISPERIDONE 3 MG PO TABS
6.0000 mg | ORAL_TABLET | Freq: Every day | ORAL | Status: DC
  Filled 2019-02-01: qty 28

## 2019-02-01 MED ORDER — FLUOXETINE HCL 20 MG PO CAPS: 20.0000 mg | ORAL_CAPSULE | Freq: Every day | ORAL | 1 refills | Status: AC

## 2019-02-01 MED ORDER — TEMAZEPAM 30 MG PO CAPS: 30.0000 mg | ORAL_CAPSULE | Freq: Every day | ORAL | 0 refills | Status: AC

## 2019-02-01 NOTE — Plan of Care (Signed)
D: Patient is alert, pleasant, and cooperative. Patient affect is anxious. Denies SI, HI, AVH, and verbally contracts for safety. Patient denies physical symptoms/pain.    A: Medications administered per MD order. Support provided. Patient educated on safety on the unit and medications. Routine safety checks every 15 minutes. Patient stated understanding to tell nurse about any new physical symptoms. Patient understands to tell staff of any needs.     R: No adverse drug reactions noted. Patient verbally contracts for safety. Patient remains safe at this time and will continue to monitor.   Problem: Education: Goal: Mental status will improve Outcome: Progressing   Problem: Safety: Goal: Periods of time without injury will increase Outcome: Progressing  Patient denies SI, HI, AVH, and contracts for safety. Patient remains safe and will continue to monitor.

## 2019-02-01 NOTE — Progress Notes (Signed)
Recreation Therapy Notes  Date: 4.6.20 Time: 1000 Location: 500 [address] Dayroom  Group Topic: Wellness  Goal Area(s) Addresses:  Patient will define components of whole wellness. Patient will verbalize benefit of whole wellness.  Behavioral Response: Engaged  Intervention:  Music  Activity: Exercise.    Education: Wellness, Building control surveyor.   Education Outcome: Acknowledges education/In group clarification offered/Needs additional education.   Clinical Observations/Feedback:  Pt was laid back but completed the exercises. Pt was on task and engaged.  Pt was able to lead group in a few stretches and other exercises.    Caroll Rancher, LRT/CTRS     Lillia Abed, Melton Walls A 02/01/2019 11:09 AM

## 2019-02-01 NOTE — Progress Notes (Signed)
Adult Psychoeducational Group Note  Date:  02/01/2019 Time:  8:58 AM  Group Topic/Focus:  Orientation:   The focus of this group is to educate the patient on the purpose and policies of crisis stabilization and provide a format to answer questions about their admission.  The group details unit policies and expectations of patients while admitted.  Participation Level:  Active  Participation Quality:  Appropriate  Affect:  Appropriate  Cognitive:  Appropriate  Insight: Appropriate  Engagement in Group:  Engaged  Modes of Intervention:  Discussion  Additional Comments:  Pt did a good job of interacting and engaging this morning.  Pt answered all questions and he shared when asked a question by staff.  Mark Hebert Samyra Limb 02/01/2019, 8:58 AM

## 2019-02-01 NOTE — Progress Notes (Signed)
Adult Psychoeducational Group Note  Date:  02/01/2019 Time:  5:01 AM  Group Topic/Focus:  Wrap-Up Group:   The focus of this group is to help patients review their daily goal of treatment and discuss progress on daily workbooks.  Participation Level:  Active  Participation Quality:  Appropriate  Affect:  Appropriate  Cognitive:  Appropriate  Insight: Appropriate  Engagement in Group:  Engaged  Modes of Intervention:  Discussion  Additional Comments:  Pt said his day was a 5. The one positive thing that happen to him dinner was good the steak.  Charna Busman Long 02/01/2019, 5:01 AM

## 2019-02-01 NOTE — Progress Notes (Addendum)
  [facility] Adult Case Management Discharge Plan :  Will you be returning to the same living situation after discharge:  Yes,  with mother At discharge, do you have transportation home?: Yes,  mother Do you have the ability to pay for your medications: No. Will work with [employer].  Release of information consent forms completed and in the chart;  Patient's signature needed at discharge.  Patient to Follow up at: Follow-up Information    [facility] Follow up on 02/03/2019.   Why:  Telephonic hospital follow up appointment is Wednesday, 4/8 at 1:00p.  At this time, the appointment will be conducted over the phone. The provider will contact you.  Contact information: [address] [place] [address] [phone]           Next level of care provider has access to [facility] Link:no  Safety Planning and Suicide Prevention discussed: Yes,  with mother  Have you used any form of tobacco in the last 30 days? (Cigarettes, Smokeless Tobacco, Cigars, and/or Pipes): Yes  Has patient been referred to the Quitline?: Patient refused referral  Patient has been referred for addiction treatment: Yes, [facility].  Lorri Frederick, LCSW 02/01/2019, 9:57 AM

## 2019-02-01 NOTE — Progress Notes (Signed)
Patient ID: Mark Hebert, male   DOB: 08-26-1996, 23 y.o.   MRN: [id number]   D: Pt alert and oriented on the unit.   A: Education, support, and encouragement provided. Discharge summary, medications and follow up appointments reviewed with pt. Suicide prevention resources provided, including "My 3 App." Pt's belongings in locker # 39 returned and belongings sheet signed.  R: Pt denies SI/HI, A/VH, pain, or any concerns at this time. Pt ambulatory on and off unit. Pt discharged to lobby.

## 2019-02-01 NOTE — [facility] Suicide Risk Assessment (Signed)
[facility] Discharge Suicide Risk Assessment   Principal Problem: Substance induced mood disorder (HCC) Discharge Diagnoses: Principal Problem:   Substance induced mood disorder (HCC)   Total Time spent with patient: 45 minutes  Thought to be stable alert oriented and cooperative no thoughts of harming self or others no acute withdrawal Mental Status Per Nursing Assessment::   On Admission:  NA  Demographic Factors:  Unemployed  Loss Factors: NA  Historical Factors: Impulsivity  Risk Reduction Factors:   Religious beliefs about death  Continued Clinical Symptoms:  Alcohol/Substance Abuse/Dependencies  Cognitive Features That Contribute To Risk:  None    Suicide Risk:  Minimal: No identifiable suicidal ideation.  Patients presenting with no risk factors but with morbid ruminations; may be classified as minimal risk based on the severity of the depressive symptoms  Follow-up Information    [facility] Follow up on 02/03/2019.   Why:  Telephonic hospital follow up appointment is Wednesday, 4/8 at 1:00p.  At this time, the appointment will be conducted over the phone. The provider will contact you.  Contact information: [address] [place] [address] [phone]           Plan Of Care/Follow-up recommendations:  Activity:  full  Jamarii Banks, MD 02/01/2019, 9:37 AM
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
## 2019-09-14 ENCOUNTER — Other Ambulatory Visit: Payer: Self-pay

## 2019-09-14 ENCOUNTER — Emergency Department (HOSPITAL_COMMUNITY)
Admission: EM | Admit: 2019-09-14 | Discharge: 2019-09-14 | Discharge status: Against Medical Advice | Payer: Self-pay | Attending: Emergency Medicine | Admitting: Emergency Medicine

## 2019-09-14 ENCOUNTER — Encounter (HOSPITAL_COMMUNITY): Payer: Self-pay

## 2019-09-14 DIAGNOSIS — Z638 Other specified problems related to primary support group: Secondary | ICD-10-CM | POA: Insufficient documentation

## 2019-09-14 DIAGNOSIS — Z5329 Procedure and treatment not carried out because of patient's decision for other reasons: Secondary | ICD-10-CM | POA: Insufficient documentation
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
## 2019-09-14 NOTE — ED Notes (Signed)
Patient called taxi and stated that he was leaving. Peer support to follow up with patient.

## 2019-09-14 NOTE — ED Notes (Signed)
Patient visualized walking out of department. This Probation officer questioned patient, patient stated "I guess I'm gonna go" This Probation officer told patient "I believe we are waiting for peer support to come see you." RN Taquita informed this Probation officer that Peer Support leaves at 5 and so they will follow up with patient or come to his house. Patient given taxi number to call. Patient visualized leaving department.
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
## 2019-11-16 IMAGING — DX PORTABLE CHEST - 1 VIEW
2 series · 2 of 2 positions shown · non-contrast
Comparison: None.

CLINICAL DATA: Dry cough for 1 week

EXAM:
PORTABLE CHEST 1 VIEW

[chest ap (1 of 2)]
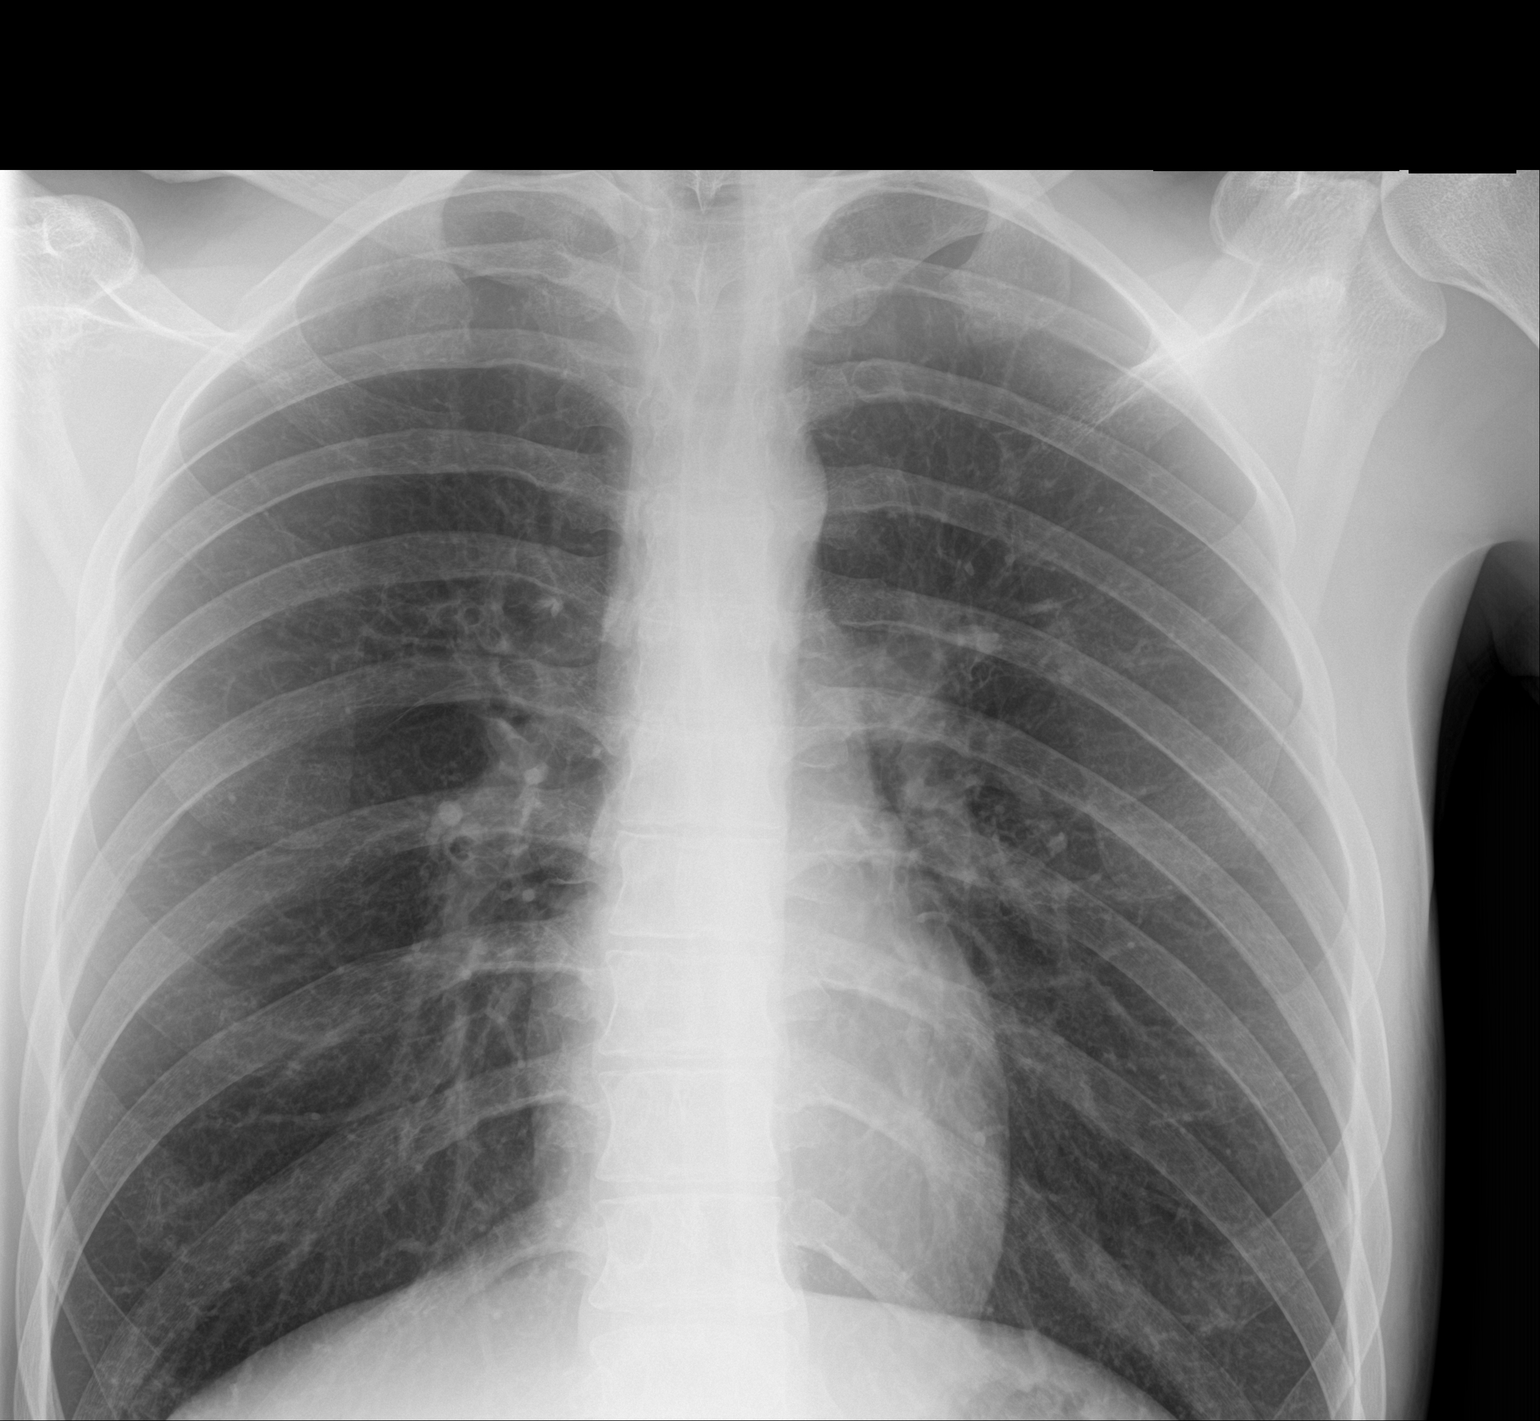

[chest ap (2 of 2)]
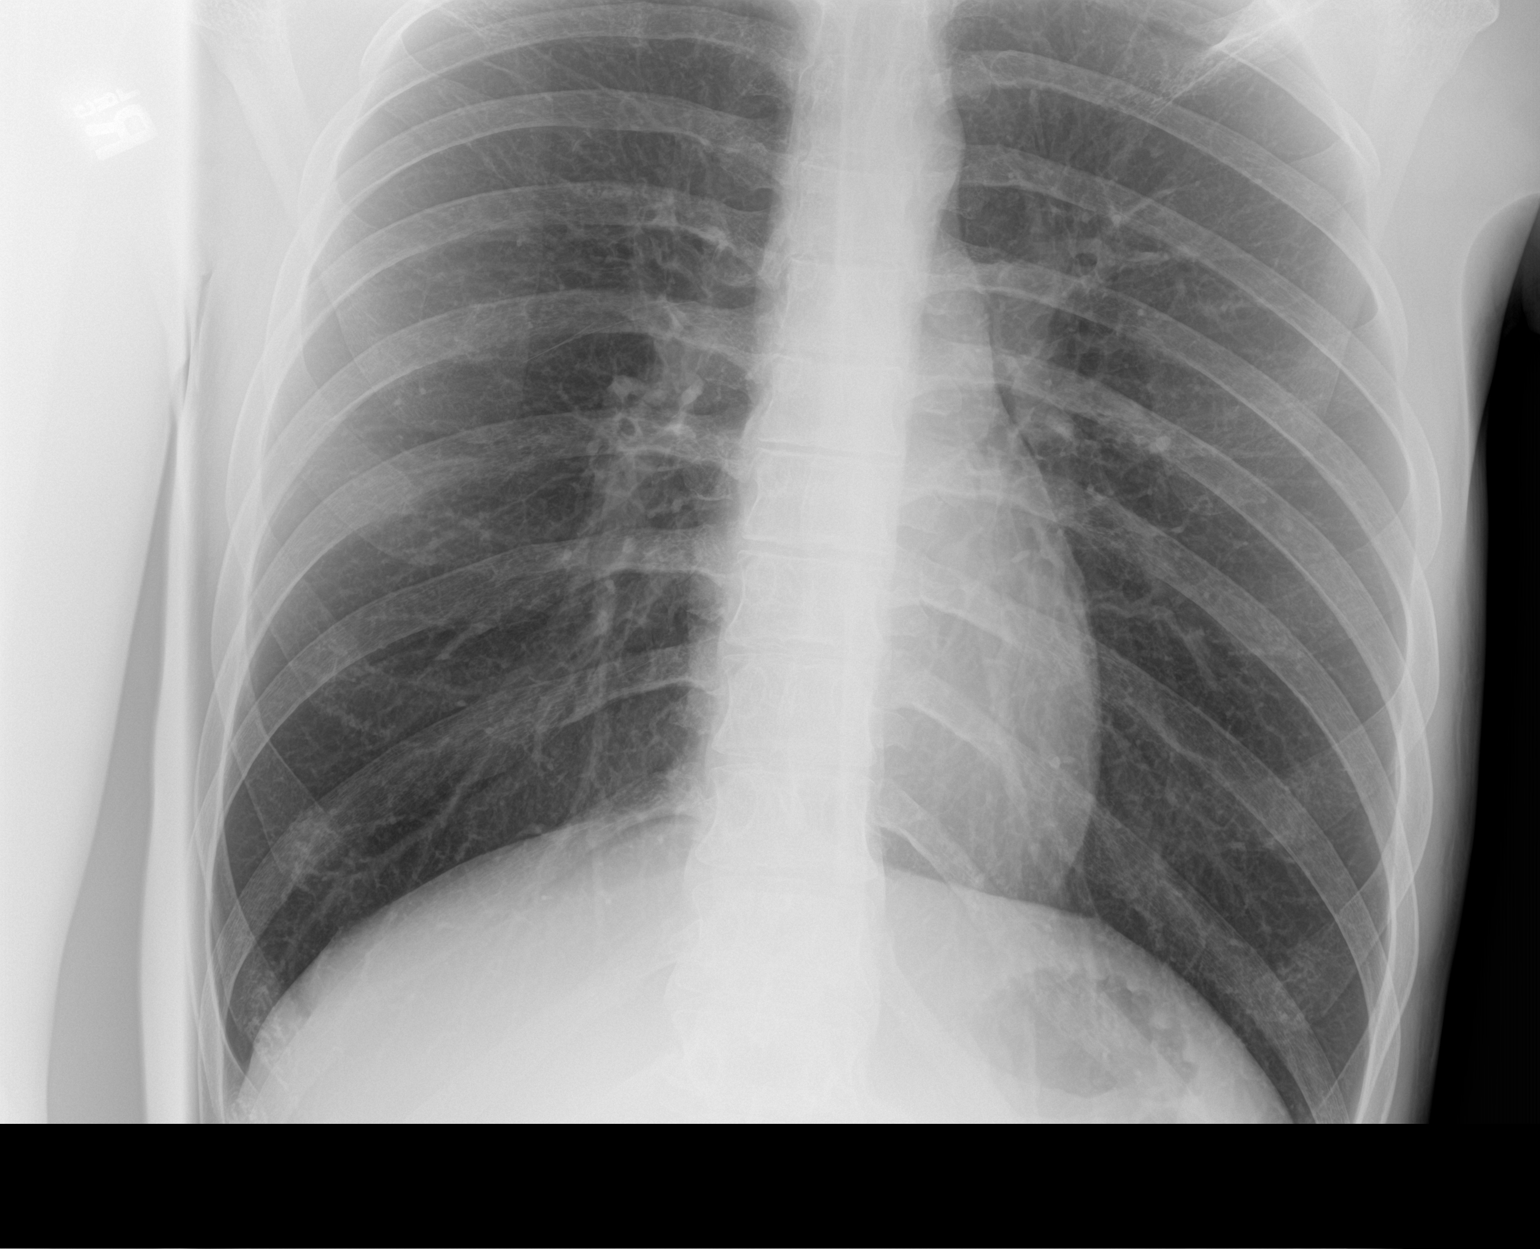

[2 of 2 positions shown; findings below may reference images not displayed]

FINDINGS: The heart size and mediastinal contours are within normal limits.
Both lungs are clear. The visualized skeletal structures are
unremarkable.
IMPRESSION: No active disease.

## 2023-03-16 ENCOUNTER — Encounter (HOSPITAL_COMMUNITY): Payer: Self-pay

## 2023-03-16 ENCOUNTER — Other Ambulatory Visit: Payer: Self-pay

## 2023-03-16 ENCOUNTER — Emergency Department (HOSPITAL_COMMUNITY)
Admission: EM | Admit: 2023-03-16 | Discharge: 2023-03-17 | Disposition: A | Discharge status: Home / Self Care | Payer: BC Managed Care – PPO | Attending: Emergency Medicine | Admitting: Emergency Medicine

## 2023-03-16 DIAGNOSIS — R220 Localized swelling, mass and lump, head: Secondary | ICD-10-CM | POA: Diagnosis present

## 2023-03-16 DIAGNOSIS — K029 Dental caries, unspecified: Secondary | ICD-10-CM | POA: Diagnosis not present

## 2023-03-16 NOTE — ED Triage Notes (Signed)
Pt. Arrives POV c/o right sided dental pain. Pt. States that he has been hurting since Thursday. Pt. States that the last time something like this happened, he had to have dental surgery. Pt. Also c/o a headache.

## 2023-03-17 DIAGNOSIS — K029 Dental caries, unspecified: Secondary | ICD-10-CM | POA: Diagnosis not present

## 2023-03-17 MED ORDER — AMOXICILLIN-POT CLAVULANATE 875-125 MG PO TABS
1.0000 | ORAL_TABLET | Freq: Once | ORAL | Status: AC
  Administered 2023-03-17: 1 via ORAL
  Filled 2023-03-17: qty 1

## 2023-03-17 MED ORDER — AMOXICILLIN-POT CLAVULANATE 875-125 MG PO TABS: 1.0000 | ORAL_TABLET | Freq: Two times a day (BID) | ORAL | 0 refills | Status: AC

## 2023-03-17 MED ORDER — IBUPROFEN 200 MG PO TABS
600.0000 mg | ORAL_TABLET | Freq: Once | ORAL | Status: AC
  Administered 2023-03-17: 600 mg via ORAL
  Filled 2023-03-17: qty 3

## 2023-03-17 MED ORDER — IBUPROFEN 600 MG PO TABS: 600.0000 mg | ORAL_TABLET | Freq: Four times a day (QID) | ORAL | 0 refills | Status: AC | PRN

## 2023-03-17 NOTE — ED Provider Notes (Signed)
Mark Hebert EMERGENCY DEPARTMENT AT [facility] Provider Note   CSN: [id number] Arrival date & time: 03/16/23  2256     History  Chief Complaint  Patient presents with   Dental Pain    Mark Hebert is a 27 y.o. male.  The history is provided by the patient.  Dental Pain Mark Hebert is a 27 y.o. male who presents to the Emergency Department complaining of dental pain.  He presents emergency department complaining of right-sided dental pain that woke him around 9 PM.  Pain is located in the right upper teeth as well as a right lower molar.  He feels like his face is a little bit swollen in that area.  He took Tylenol before arriving to the emergency department but feels that it did not work.  He has a history of same in the past and he required dental surgery and antibiotics.  He is concerned that he will not sleep tonight and miss work due to pain.  He does not currently have a dentist.       Home Medications Prior to Admission medications   Medication Sig Start Date End Date Taking? Authorizing Provider  amoxicillin-clavulanate (AUGMENTIN) 875-125 MG tablet Take 1 tablet by mouth every 12 (twelve) hours. 03/17/23  Yes Tilden Fossa, MD  ibuprofen (ADVIL) 600 MG tablet Take 1 tablet (600 mg total) by mouth every 6 (six) hours as needed. 03/17/23  Yes Tilden Fossa, MD  benztropine (COGENTIN) 0.5 MG tablet Take 1 tablet (0.5 mg total) by mouth 2 (two) times daily. 02/01/19   Malvin Johns, MD  FLUoxetine (PROZAC) 20 MG capsule Take 1 capsule (20 mg total) by mouth daily. 02/02/19   Malvin Johns, MD  gabapentin (NEURONTIN) 300 MG capsule Take 1 capsule (300 mg total) by mouth 3 (three) times daily. 02/01/19   Aldean Baker, NP  risperiDONE (RISPERDAL) 3 MG tablet Take 2 tablets (6 mg total) by mouth at bedtime. 02/01/19   Malvin Johns, MD  temazepam (RESTORIL) 30 MG capsule Take 1 capsule (30 mg total) by mouth at bedtime. 02/01/19   Malvin Johns, MD      Allergies    Patient has no  known allergies.    Review of Systems   Review of Systems  All other systems reviewed and are negative.   Physical Exam Updated Vital Signs BP (!) 145/93 (BP Location: Left Arm)   Pulse 72   Temp 98.2 F (36.8 C) (Oral)   Resp 18   Ht 6\' 3"  (1.905 m)   Wt 81.6 kg   SpO2 100%   BMI 22.50 kg/m  Physical Exam Vitals and nursing note reviewed.  Constitutional:      Appearance: He is well-developed.  HENT:     Head: Normocephalic and atraumatic.     Comments: Extensive dental caries without significant soft tissue swelling to the mucosa.  No identifiable dental abscess.  Oropharynx without erythema or edema. Cardiovascular:     Rate and Rhythm: Normal rate and regular rhythm.  Pulmonary:     Effort: Pulmonary effort is normal. No respiratory distress.  Musculoskeletal:        General: No tenderness.  Skin:    General: Skin is warm and dry.  Neurological:     Mental Status: He is alert and oriented to person, place, and time.  Psychiatric:        Behavior: Behavior normal.     ED Results / Procedures / Treatments   Labs (all labs ordered  are listed, but only abnormal results are displayed) Labs Reviewed - No data to display  EKG None  Radiology No results found.  Procedures Procedures    Medications Ordered in ED Medications  amoxicillin-clavulanate (AUGMENTIN) 875-125 MG per tablet 1 tablet (has no administration in time range)  ibuprofen (ADVIL) tablet 600 mg (has no administration in time range)    ED Course/ Medical Decision Making/ A&P                             Medical Decision Making Risk OTC drugs. Prescription drug management.   Dental pain.  He presents emergency department for evaluation of right-sided dental pain to the right upper and lower teeth.  On examination he has extensive dental caries without identifiable abscess.  He is nontoxic-appearing with no respiratory distress.  He has no systemic complaints.  Discussed with patient  importance of dentistry follow-up.  Will start antibiotics for possible early abscess as patient does have subjective sensation that his face is swollen.  Discussed ibuprofen for pain control-Will prescribe.  Discussed outpatient follow-up and return precautions.        Final Clinical Impression(s) / ED Diagnoses Final diagnoses:  Pain due to dental caries    Rx / DC Orders ED Discharge Orders          Ordered    amoxicillin-clavulanate (AUGMENTIN) 875-125 MG tablet  Every 12 hours        03/17/23 0050    ibuprofen (ADVIL) 600 MG tablet  Every 6 hours PRN        03/17/23 0050              Tilden Fossa, MD 03/17/23 [phone]
